# Patient Record
Sex: Male | Born: 1960 | Race: White | Hispanic: No | Smoking: Former smoker
Health system: Southern US, Community
[De-identification: ages and names within clinical notes are randomized; demographics above are authoritative.]

## PROBLEM LIST (undated history)

## (undated) DIAGNOSIS — E119 Type 2 diabetes mellitus without complications: Secondary | ICD-10-CM

## (undated) DIAGNOSIS — I2510 Atherosclerotic heart disease of native coronary artery without angina pectoris: Secondary | ICD-10-CM

## (undated) DIAGNOSIS — J45909 Unspecified asthma, uncomplicated: Secondary | ICD-10-CM

## (undated) DIAGNOSIS — M1990 Unspecified osteoarthritis, unspecified site: Secondary | ICD-10-CM

## (undated) DIAGNOSIS — M549 Dorsalgia, unspecified: Secondary | ICD-10-CM

## (undated) DIAGNOSIS — I10 Essential (primary) hypertension: Secondary | ICD-10-CM

## (undated) DIAGNOSIS — I509 Heart failure, unspecified: Secondary | ICD-10-CM

## (undated) DIAGNOSIS — J449 Chronic obstructive pulmonary disease, unspecified: Secondary | ICD-10-CM

---

## 1998-03-21 ENCOUNTER — Encounter: Payer: Self-pay | Admitting: Internal Medicine

## 1998-03-21 ENCOUNTER — Emergency Department (HOSPITAL_COMMUNITY): Admission: EM | Admit: 1998-03-21 | Discharge: 1998-03-21 | Payer: Self-pay | Admitting: Internal Medicine

## 2011-04-18 HISTORY — PX: VENTRAL HERNIA REPAIR: SHX424

## 2017-05-04 ENCOUNTER — Inpatient Hospital Stay (HOSPITAL_BASED_OUTPATIENT_CLINIC_OR_DEPARTMENT_OTHER)
Admission: EM | Admit: 2017-05-04 | Discharge: 2017-05-07 | DRG: 389 | Disposition: A | Discharge status: Home / Self Care | Payer: Medicare HMO | Attending: Internal Medicine | Admitting: Internal Medicine

## 2017-05-04 ENCOUNTER — Other Ambulatory Visit: Payer: Self-pay

## 2017-05-04 ENCOUNTER — Encounter (HOSPITAL_BASED_OUTPATIENT_CLINIC_OR_DEPARTMENT_OTHER): Payer: Self-pay | Admitting: Emergency Medicine

## 2017-05-04 ENCOUNTER — Emergency Department (HOSPITAL_BASED_OUTPATIENT_CLINIC_OR_DEPARTMENT_OTHER): Payer: Medicare HMO

## 2017-05-04 DIAGNOSIS — G4733 Obstructive sleep apnea (adult) (pediatric): Secondary | ICD-10-CM | POA: Diagnosis present

## 2017-05-04 DIAGNOSIS — J449 Chronic obstructive pulmonary disease, unspecified: Secondary | ICD-10-CM | POA: Diagnosis present

## 2017-05-04 DIAGNOSIS — N179 Acute kidney failure, unspecified: Secondary | ICD-10-CM | POA: Diagnosis present

## 2017-05-04 DIAGNOSIS — I2510 Atherosclerotic heart disease of native coronary artery without angina pectoris: Secondary | ICD-10-CM | POA: Diagnosis present

## 2017-05-04 DIAGNOSIS — Z8679 Personal history of other diseases of the circulatory system: Secondary | ICD-10-CM

## 2017-05-04 DIAGNOSIS — F40240 Claustrophobia: Secondary | ICD-10-CM | POA: Diagnosis present

## 2017-05-04 DIAGNOSIS — Z6836 Body mass index (BMI) 36.0-36.9, adult: Secondary | ICD-10-CM

## 2017-05-04 DIAGNOSIS — K56609 Unspecified intestinal obstruction, unspecified as to partial versus complete obstruction: Secondary | ICD-10-CM | POA: Diagnosis not present

## 2017-05-04 DIAGNOSIS — D72829 Elevated white blood cell count, unspecified: Secondary | ICD-10-CM | POA: Diagnosis present

## 2017-05-04 DIAGNOSIS — Z0189 Encounter for other specified special examinations: Secondary | ICD-10-CM

## 2017-05-04 DIAGNOSIS — E1169 Type 2 diabetes mellitus with other specified complication: Secondary | ICD-10-CM

## 2017-05-04 DIAGNOSIS — Z87891 Personal history of nicotine dependence: Secondary | ICD-10-CM

## 2017-05-04 DIAGNOSIS — E1165 Type 2 diabetes mellitus with hyperglycemia: Secondary | ICD-10-CM | POA: Diagnosis present

## 2017-05-04 DIAGNOSIS — E860 Dehydration: Secondary | ICD-10-CM | POA: Diagnosis present

## 2017-05-04 DIAGNOSIS — R112 Nausea with vomiting, unspecified: Secondary | ICD-10-CM | POA: Diagnosis not present

## 2017-05-04 DIAGNOSIS — E669 Obesity, unspecified: Secondary | ICD-10-CM

## 2017-05-04 DIAGNOSIS — I10 Essential (primary) hypertension: Secondary | ICD-10-CM | POA: Diagnosis present

## 2017-05-04 DIAGNOSIS — E875 Hyperkalemia: Secondary | ICD-10-CM | POA: Diagnosis present

## 2017-05-04 DIAGNOSIS — E871 Hypo-osmolality and hyponatremia: Secondary | ICD-10-CM | POA: Diagnosis present

## 2017-05-04 HISTORY — DX: Chronic obstructive pulmonary disease, unspecified: J44.9

## 2017-05-04 HISTORY — DX: Atherosclerotic heart disease of native coronary artery without angina pectoris: I25.10

## 2017-05-04 HISTORY — DX: Essential (primary) hypertension: I10

## 2017-05-04 HISTORY — DX: Dorsalgia, unspecified: M54.9

## 2017-05-04 HISTORY — DX: Unspecified osteoarthritis, unspecified site: M19.90

## 2017-05-04 HISTORY — DX: Type 2 diabetes mellitus without complications: E11.9

## 2017-05-04 HISTORY — DX: Unspecified asthma, uncomplicated: J45.909

## 2017-05-04 HISTORY — DX: Heart failure, unspecified: I50.9

## 2017-05-04 LAB — CBC
HCT: 43 % (ref 39.0–52.0)
HEMOGLOBIN: 14.4 g/dL (ref 13.0–17.0)
MCH: 28.1 pg (ref 26.0–34.0)
MCHC: 33.5 g/dL (ref 30.0–36.0)
MCV: 83.8 fL (ref 78.0–100.0)
PLATELETS: 520 10*3/uL — AB (ref 150–400)
RBC: 5.13 MIL/uL (ref 4.22–5.81)
RDW: 13.9 % (ref 11.5–15.5)
WBC: 11.9 10*3/uL — AB (ref 4.0–10.5)

## 2017-05-04 LAB — BASIC METABOLIC PANEL
Anion gap: 14 (ref 5–15)
BUN: 31 mg/dL — ABNORMAL HIGH (ref 6–20)
CO2: 20 mmol/L — ABNORMAL LOW (ref 22–32)
Calcium: 9.9 mg/dL (ref 8.9–10.3)
Chloride: 95 mmol/L — ABNORMAL LOW (ref 101–111)
Creatinine, Ser: 1.54 mg/dL — ABNORMAL HIGH (ref 0.61–1.24)
GFR calc Af Amer: 55 mL/min — ABNORMAL LOW (ref >60)
GFR calc non Af Amer: 48 mL/min — ABNORMAL LOW (ref >60)
Glucose, Bld: 223 mg/dL — ABNORMAL HIGH (ref 65–99)
Potassium: 5.5 mmol/L — ABNORMAL HIGH (ref 3.5–5.1)
Sodium: 129 mmol/L — ABNORMAL LOW (ref 135–145)

## 2017-05-04 LAB — HEPATIC FUNCTION PANEL
ALK PHOS: 63 U/L (ref 38–126)
ALT: 12 U/L — AB (ref 17–63)
AST: 26 U/L (ref 15–41)
Albumin: 4.2 g/dL (ref 3.5–5.0)
BILIRUBIN DIRECT: 0.1 mg/dL (ref 0.1–0.5)
Indirect Bilirubin: 0.4 mg/dL (ref 0.3–0.9)
Total Bilirubin: 0.5 mg/dL (ref 0.3–1.2)
Total Protein: 9.1 g/dL — ABNORMAL HIGH (ref 6.5–8.1)

## 2017-05-04 LAB — LIPASE, BLOOD: Lipase: 48 U/L (ref 11–51)

## 2017-05-04 MED ORDER — MORPHINE SULFATE (PF) 4 MG/ML IV SOLN: 4.0000 mg | Freq: Once | INTRAVENOUS | Status: DC

## 2017-05-04 MED ORDER — ONDANSETRON HCL 4 MG/2ML IJ SOLN
4.0000 mg | Freq: Once | INTRAMUSCULAR | Status: AC
  Administered 2017-05-05: 4 mg via INTRAVENOUS
  Filled 2017-05-04: qty 2

## 2017-05-04 MED ORDER — SODIUM CHLORIDE 0.9 % IV BOLUS (SEPSIS)
500.0000 mL | Freq: Once | INTRAVENOUS | Status: AC
  Administered 2017-05-04: 500 mL via INTRAVENOUS

## 2017-05-04 MED ORDER — ONDANSETRON 4 MG PO TBDP
4.0000 mg | ORAL_TABLET | Freq: Once | ORAL | Status: AC
  Administered 2017-05-04: 4 mg via ORAL
  Filled 2017-05-04: qty 1

## 2017-05-04 MED ORDER — SODIUM CHLORIDE 0.9 % IV SOLN
INTRAVENOUS | Status: DC
  Administered 2017-05-05: via INTRAVENOUS

## 2017-05-04 MED ORDER — IOPAMIDOL (ISOVUE-300) INJECTION 61%
100.0000 mL | Freq: Once | INTRAVENOUS | Status: AC | PRN
  Administered 2017-05-04: 100 mL via INTRAVENOUS

## 2017-05-04 MED ORDER — HYDROMORPHONE HCL 1 MG/ML IJ SOLN
1.0000 mg | Freq: Once | INTRAMUSCULAR | Status: AC
  Administered 2017-05-05: 1 mg via INTRAVENOUS
  Filled 2017-05-04: qty 1

## 2017-05-04 MED ORDER — DICYCLOMINE HCL 10 MG PO CAPS
10.0000 mg | ORAL_CAPSULE | Freq: Once | ORAL | Status: AC
Start: 2017-05-04 — End: 2017-05-04
  Administered 2017-05-04: 10 mg via ORAL
  Filled 2017-05-04: qty 1

## 2017-05-04 MED ORDER — FENTANYL CITRATE (PF) 100 MCG/2ML IJ SOLN
50.0000 ug | Freq: Once | INTRAMUSCULAR | Status: AC
  Administered 2017-05-04: 50 ug via INTRAVENOUS
  Filled 2017-05-04: qty 2

## 2017-05-04 NOTE — ED Provider Notes (Signed)
[facility] EMERGENCY DEPARTMENT Provider Note   CSN: [id number] Arrival date & time: 05/04/17  2021  History   Chief Complaint Chief Complaint  Patient presents with  . Emesis    HPI Steven Rubio is a 57 y.o. male with history of COPD and T2DM, CAD and CHF presenting with nausea, vomiting and diarrhea starting yesterday evening. Patient reports he has had many watery stools without hematochezia or melena. He reports 7 episodes of NBNB emesis. He endorses muscle aches particularly in his stomach. Afebrile at home with temperatures 98.1-99.22F. He reports he has been tolerating fluids and has a bottle of gatorade that is almost empty with him in bed. No dysuria. No sore throat, rhinorrhea, or congestion. No chest pain or dyspnea.  Past Medical History:  Diagnosis Date  . Arthritis   . Asthma   . Back pain   . CHF (congestive heart failure) (HCC)   . COPD (chronic obstructive pulmonary disease) (HCC)   . Coronary artery disease   . Diabetes mellitus without complication (HCC)   . Hypertension     There are no active problems to display for this patient.   Past Surgical History:  Procedure Laterality Date  . HERNIA REPAIR       Home Medications    Prior to Admission medications   Medication Sig Start Date End Date Taking? Authorizing Provider  meloxicam (MOBIC) 7.5 MG tablet Take 7.5 mg by mouth daily.   Yes [provider]  NIFEdipine (PROCARDIA-XL/ADALAT CC) 60 MG 24 hr tablet Take 60 mg by mouth daily.   Yes [provider]  oxyCODONE-acetaminophen (PERCOCET/ROXICET) 5-325 MG tablet Take by mouth every 4 (four) hours as needed for severe pain.   Yes [provider]    Family History History reviewed. No pertinent family history.  Social History Social History   Tobacco Use  . Smoking status: Former Games developermoker  . Smokeless tobacco: Never Used  Substance Use Topics  . Alcohol use: No    Frequency: Never  . Drug use: No      Allergies   Penicillins   Review of Systems Review of Systems See HPI For ROS  Physical Exam Updated Vital Signs BP (!) 153/88   Pulse 96   Temp 97.7 F (36.5 C) (Oral)   Resp (!) 22   Ht 5\' 7"  (1.702 m)   Wt 101.6 kg (224 lb)   SpO2 90%   BMI 35.08 kg/m   Physical Exam  Constitutional: He is oriented to person, place, and time. He appears well-developed and well-nourished.  HENT:  Head: Normocephalic and atraumatic.  Mouth/Throat: Oropharynx is clear and moist. No oropharyngeal exudate.  Eyes: EOM are normal. Pupils are equal, round, and reactive to light.  Neck: Neck supple.  Cardiovascular: Normal rate and regular rhythm. Exam reveals no gallop and no friction rub.  No murmur heard. Pulmonary/Chest: Breath sounds normal. He has no wheezes. He has no rales.  Abdominal: Soft. He exhibits no distension. There is no tenderness. There is no rebound and no guarding.  Musculoskeletal: Normal range of motion.  Neurological: He is alert and oriented to person, place, and time.  Skin: Skin is warm and dry.     ED Treatments / Results  Labs (all labs ordered are listed, but only abnormal results are displayed) Labs Reviewed  BASIC METABOLIC PANEL - Abnormal; Notable for the following components:      Result Value   Sodium 129 (*)    Potassium 5.5 (*)  Chloride 95 (*)    CO2 20 (*)    Glucose, Bld 223 (*)    BUN 31 (*)    Creatinine, Ser 1.54 (*)    GFR calc non Af Amer 48 (*)    GFR calc Af Amer 55 (*)    All other components within normal limits  CBC - Abnormal; Notable for the following components:   WBC 11.9 (*)    Platelets 520 (*)    All other components within normal limits  HEPATIC FUNCTION PANEL  LIPASE, BLOOD    EKG  EKG Interpretation None       Radiology No results found.  Procedures Procedures (including critical care time)  Medications Ordered in ED Medications  sodium chloride 0.9 % bolus 500 mL (0 mLs Intravenous Stopped  05/04/17 2139)  ondansetron (ZOFRAN-ODT) disintegrating tablet 4 mg (4 mg Oral Given 05/04/17 2118)  sodium chloride 0.9 % bolus 500 mL (0 mLs Intravenous Stopped 05/04/17 2229)  dicyclomine (BENTYL) capsule 10 mg (10 mg Oral Given 05/04/17 2232)  fentaNYL (SUBLIMAZE) injection 50 mcg (50 mcg Intravenous Given 05/04/17 2305)  iopamidol (ISOVUE-300) 61 % injection 100 mL (100 mLs Intravenous Contrast Given 05/04/17 2308)   Initial Impression / Assessment and Plan / ED Course  I have reviewed the triage vital signs and the nursing notes.  Pertinent labs & imaging results that were available during my care of the patient were reviewed by me and considered in my medical decision making (see chart for details).    57 year old male with COPD and CHF presents with N/V at home x1 day with diarrhea, most concerning for a viral illness. He was treated with zofran and gentle fluid bolus in ED. Tolerating fluids well. BMP suggestive of dehydration.   Patient monitored in ED and noted to have worsening abdominal pain as well as continued diarrhea. Concern for possible diverticulitis and so CT abdomen was ordered.   Care was taken over by Dr. Elesa Massed at 11:15PM.   Final Clinical Impressions(s) / ED Diagnoses   Final diagnoses:  None    ED Discharge Orders    None       Howard Pouch, MD 05/04/17 1610    Gwyneth Sprout, MD 05/05/17 1105

## 2017-05-04 NOTE — ED Triage Notes (Signed)
pateint states that he started to have emesis starting last night. The patient reports that he is having N/V/D

## 2017-05-04 NOTE — ED Provider Notes (Signed)
12:00 AM  Assumed care from Dr. Anitra LauthPlunkett.  Patient is a 57 year old morbidly obese male with history of COPD, CHF who presents to the emergency department with nausea, vomiting or diarrhea.  Pain increasing throughout ED's visit.  Found to be mildly hyponatremic, hyperkalemic and mild AK I.  Receiving IV fluids.  Plan is to obtain CT scan for further evaluation.  1:20 AM CT scan shows small bowel obstruction.  Transition point not confidently identified.  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  Will discuss with medicine for admission.  Will place NG tube.  D/w Dr. Michaell CowingGross.  Surgery will see in consult.  Patient has never had a bowel obstruction before.  He has had one previous her[facility] repair [facility].  1:28 AM Discussed patient's case with hospitalist, Dr. Katrinka BlazingSmith.  I have recommended admission and patient (and family if present) agree with this plan. Admitting physician will place admission orders.   I reviewed all nursing notes, vitals, pertinent previous records, EKGs, lab and urine results, imaging (as available).  2:20 AM  Pt's NG tube placed by nursing staff.  X-ray confirms that it is in the tip of the stomach.  He is already feeling better.  Remains hemodynamically stable.  [facility]  EKG obtained given his hyperkalemia which shows no peak T waves or interval abnormality.  He has received IV hydration.  Suspect hyperkalemia secondary to dehydration from vomiting and diarrhea.  Standing orders for pain medication, nausea medicine IV fluids have been ordered.  He is n.p.o.  We have discussed with him the importance of bowel rest.     EKG Interpretation  Date/Time:  Saturday May 05 2017 01:57:21 EST Ventricular Rate:  99 PR Interval:    QRS Duration: 87 QT Interval:  349 QTC Calculation: 448 R Axis:   75 Text Interpretation:  Sinus rhythm Borderline T abnormalities, inferior leads No old tracing to compare  Confirmed by Keilly Fatula, Baxter HireKristen [phone] on 05/05/2017 2:17:23 AM         Brailynn Breth, Layla MawKristen N, DO 05/05/17 [id number]

## 2017-05-04 NOTE — ED Notes (Signed)
Up to b/r by w/c with family.

## 2017-05-04 NOTE — ED Notes (Addendum)
Alert, NAD, calm, interactive, resps e/u, speaking in clear complete sentences, no dyspnea noted, skin W&D, VSS, c/o 2d of NVD and subjective fever, Vx8, Dx12, describes emesis as pinkish, stool as liquid, states, "have been able to keep down some liquids (gatorade/water 50/50)", (denies: recent illness, URI sx, GU sx, sob, bleeding, dizziness or visual changes). EDP Dr. Mosetta PuttFeng into room. [facility] at [facility].

## 2017-05-05 ENCOUNTER — Emergency Department (HOSPITAL_BASED_OUTPATIENT_CLINIC_OR_DEPARTMENT_OTHER): Payer: Medicare HMO

## 2017-05-05 ENCOUNTER — Inpatient Hospital Stay (HOSPITAL_COMMUNITY): Payer: Medicare HMO

## 2017-05-05 DIAGNOSIS — Z6836 Body mass index (BMI) 36.0-36.9, adult: Secondary | ICD-10-CM | POA: Diagnosis not present

## 2017-05-05 DIAGNOSIS — J449 Chronic obstructive pulmonary disease, unspecified: Secondary | ICD-10-CM | POA: Diagnosis present

## 2017-05-05 DIAGNOSIS — D72829 Elevated white blood cell count, unspecified: Secondary | ICD-10-CM | POA: Diagnosis present

## 2017-05-05 DIAGNOSIS — Z8679 Personal history of other diseases of the circulatory system: Secondary | ICD-10-CM | POA: Diagnosis not present

## 2017-05-05 DIAGNOSIS — R112 Nausea with vomiting, unspecified: Secondary | ICD-10-CM | POA: Diagnosis present

## 2017-05-05 DIAGNOSIS — K56609 Unspecified intestinal obstruction, unspecified as to partial versus complete obstruction: Principal | ICD-10-CM | POA: Diagnosis present

## 2017-05-05 DIAGNOSIS — E1169 Type 2 diabetes mellitus with other specified complication: Secondary | ICD-10-CM | POA: Diagnosis not present

## 2017-05-05 DIAGNOSIS — F40240 Claustrophobia: Secondary | ICD-10-CM | POA: Diagnosis present

## 2017-05-05 DIAGNOSIS — Z87891 Personal history of nicotine dependence: Secondary | ICD-10-CM | POA: Diagnosis not present

## 2017-05-05 DIAGNOSIS — N179 Acute kidney failure, unspecified: Secondary | ICD-10-CM

## 2017-05-05 DIAGNOSIS — G4733 Obstructive sleep apnea (adult) (pediatric): Secondary | ICD-10-CM | POA: Diagnosis present

## 2017-05-05 DIAGNOSIS — I10 Essential (primary) hypertension: Secondary | ICD-10-CM | POA: Diagnosis present

## 2017-05-05 DIAGNOSIS — E669 Obesity, unspecified: Secondary | ICD-10-CM | POA: Diagnosis not present

## 2017-05-05 DIAGNOSIS — E1165 Type 2 diabetes mellitus with hyperglycemia: Secondary | ICD-10-CM | POA: Diagnosis present

## 2017-05-05 DIAGNOSIS — E860 Dehydration: Secondary | ICD-10-CM | POA: Diagnosis present

## 2017-05-05 DIAGNOSIS — E875 Hyperkalemia: Secondary | ICD-10-CM

## 2017-05-05 DIAGNOSIS — E871 Hypo-osmolality and hyponatremia: Secondary | ICD-10-CM | POA: Diagnosis present

## 2017-05-05 DIAGNOSIS — I2510 Atherosclerotic heart disease of native coronary artery without angina pectoris: Secondary | ICD-10-CM | POA: Diagnosis present

## 2017-05-05 LAB — CBC WITH DIFFERENTIAL/PLATELET
BASOS PCT: 0 %
Basophils Absolute: 0 10*3/uL (ref 0.0–0.1)
EOS ABS: 0.2 10*3/uL (ref 0.0–0.7)
EOS PCT: 2 %
HCT: 39.8 % (ref 39.0–52.0)
HEMOGLOBIN: 13.5 g/dL (ref 13.0–17.0)
Lymphocytes Relative: 25 %
Lymphs Abs: 2.5 10*3/uL (ref 0.7–4.0)
MCH: 28.7 pg (ref 26.0–34.0)
MCHC: 33.9 g/dL (ref 30.0–36.0)
MCV: 84.5 fL (ref 78.0–100.0)
Monocytes Absolute: 1.1 10*3/uL — ABNORMAL HIGH (ref 0.1–1.0)
Monocytes Relative: 11 %
Neutro Abs: 6.1 10*3/uL (ref 1.7–7.7)
Neutrophils Relative %: 62 %
PLATELETS: 429 10*3/uL — AB (ref 150–400)
RBC: 4.71 MIL/uL (ref 4.22–5.81)
RDW: 13.7 % (ref 11.5–15.5)
WBC: 9.9 10*3/uL (ref 4.0–10.5)

## 2017-05-05 LAB — BASIC METABOLIC PANEL
Anion gap: 10 (ref 5–15)
BUN: 29 mg/dL — ABNORMAL HIGH (ref 6–20)
CHLORIDE: 95 mmol/L — AB (ref 101–111)
CO2: 25 mmol/L (ref 22–32)
CREATININE: 1.16 mg/dL (ref 0.61–1.24)
Calcium: 9.5 mg/dL (ref 8.9–10.3)
Glucose, Bld: 145 mg/dL — ABNORMAL HIGH (ref 65–99)
Potassium: 4.9 mmol/L (ref 3.5–5.1)
SODIUM: 130 mmol/L — AB (ref 135–145)

## 2017-05-05 LAB — GLUCOSE, CAPILLARY
GLUCOSE-CAPILLARY: 136 mg/dL — AB (ref 65–99)
GLUCOSE-CAPILLARY: 120 mg/dL — AB (ref 65–99)
Glucose-Capillary: 150 mg/dL — ABNORMAL HIGH (ref 65–99)
Glucose-Capillary: 115 mg/dL — ABNORMAL HIGH (ref 65–99)

## 2017-05-05 MED ORDER — BISACODYL 10 MG RE SUPP
10.0000 mg | Freq: Every day | RECTAL | Status: DC
  Administered 2017-05-05: 10 mg via RECTAL
  Filled 2017-05-05 (×3): qty 1

## 2017-05-05 MED ORDER — INSULIN ASPART 100 UNIT/ML ~~LOC~~ SOLN
0.0000 [IU] | SUBCUTANEOUS | Status: DC
  Administered 2017-05-05: 2 [IU] via SUBCUTANEOUS
  Administered 2017-05-06 (×3): 1 [IU] via SUBCUTANEOUS

## 2017-05-05 MED ORDER — HYDROCORTISONE 1 % EX CREA: 1.0000 "application " | TOPICAL_CREAM | Freq: Three times a day (TID) | CUTANEOUS | Status: DC | PRN

## 2017-05-05 MED ORDER — PROCHLORPERAZINE EDISYLATE 5 MG/ML IJ SOLN: 5.0000 mg | INTRAMUSCULAR | Status: DC | PRN

## 2017-05-05 MED ORDER — MENTHOL 3 MG MT LOZG: 1.0000 | LOZENGE | OROMUCOSAL | Status: DC | PRN

## 2017-05-05 MED ORDER — PANTOPRAZOLE SODIUM 40 MG IV SOLR
40.0000 mg | Freq: Two times a day (BID) | INTRAVENOUS | Status: DC
  Administered 2017-05-05 – 2017-05-07 (×4): 40 mg via INTRAVENOUS
  Filled 2017-05-05 (×7): qty 40

## 2017-05-05 MED ORDER — DIPHENHYDRAMINE HCL 50 MG/ML IJ SOLN
25.0000 mg | Freq: Once | INTRAMUSCULAR | Status: AC
Start: 2017-05-05 — End: 2017-05-05
  Administered 2017-05-05: 25 mg via INTRAVENOUS
  Filled 2017-05-05: qty 1

## 2017-05-05 MED ORDER — ACETAMINOPHEN 650 MG RE SUPP: 650.0000 mg | Freq: Four times a day (QID) | RECTAL | Status: DC | PRN

## 2017-05-05 MED ORDER — ENOXAPARIN SODIUM 40 MG/0.4ML ~~LOC~~ SOLN
40.0000 mg | SUBCUTANEOUS | Status: DC
  Administered 2017-05-05 – 2017-05-07 (×3): 40 mg via SUBCUTANEOUS
  Filled 2017-05-05 (×3): qty 0.4

## 2017-05-05 MED ORDER — ONDANSETRON HCL 4 MG/2ML IJ SOLN: 4.0000 mg | Freq: Four times a day (QID) | INTRAMUSCULAR | Status: DC | PRN

## 2017-05-05 MED ORDER — METOCLOPRAMIDE HCL 5 MG/ML IJ SOLN: 5.0000 mg | Freq: Four times a day (QID) | INTRAMUSCULAR | Status: DC | PRN

## 2017-05-05 MED ORDER — LACTATED RINGERS IV BOLUS (SEPSIS)
1000.0000 mL | Freq: Once | INTRAVENOUS | Status: AC
  Administered 2017-05-05: 1000 mL via INTRAVENOUS

## 2017-05-05 MED ORDER — HYDRALAZINE HCL 20 MG/ML IJ SOLN: 10.0000 mg | INTRAMUSCULAR | Status: DC | PRN

## 2017-05-05 MED ORDER — GUAIFENESIN-DM 100-10 MG/5ML PO SYRP: 10.0000 mL | ORAL_SOLUTION | ORAL | Status: DC | PRN

## 2017-05-05 MED ORDER — IPRATROPIUM-ALBUTEROL 0.5-2.5 (3) MG/3ML IN SOLN: 3.0000 mL | RESPIRATORY_TRACT | Status: DC | PRN

## 2017-05-05 MED ORDER — ALUM & MAG HYDROXIDE-SIMETH 200-200-20 MG/5ML PO SUSP: 30.0000 mL | Freq: Four times a day (QID) | ORAL | Status: DC | PRN

## 2017-05-05 MED ORDER — DIATRIZOATE MEGLUMINE & SODIUM 66-10 % PO SOLN
90.0000 mL | Freq: Once | ORAL | Status: DC
  Filled 2017-05-05 (×2): qty 90

## 2017-05-05 MED ORDER — BENZOCAINE 20 % MT AERO
INHALATION_SPRAY | Freq: Once | OROMUCOSAL | Status: AC
  Administered 2017-05-05: 1 via OROMUCOSAL
  Filled 2017-05-05: qty 57

## 2017-05-05 MED ORDER — SODIUM CHLORIDE 0.9 % IV SOLN
INTRAVENOUS | Status: DC
  Administered 2017-05-05: 05:00:00 via INTRAVENOUS

## 2017-05-05 MED ORDER — DIPHENHYDRAMINE HCL 50 MG/ML IJ SOLN: 12.5000 mg | Freq: Four times a day (QID) | INTRAMUSCULAR | Status: DC | PRN

## 2017-05-05 MED ORDER — LIP MEDEX EX OINT
1.0000 "application " | TOPICAL_OINTMENT | Freq: Two times a day (BID) | CUTANEOUS | Status: DC
  Administered 2017-05-05 – 2017-05-07 (×5): 1 via TOPICAL
  Filled 2017-05-05: qty 7

## 2017-05-05 MED ORDER — DIATRIZOATE MEGLUMINE & SODIUM 66-10 % PO SOLN
90.0000 mL | Freq: Once | ORAL | Status: AC
  Administered 2017-05-05: 90 mL via NASOGASTRIC
  Filled 2017-05-05: qty 90

## 2017-05-05 MED ORDER — ONDANSETRON HCL 4 MG PO TABS: 4.0000 mg | ORAL_TABLET | Freq: Four times a day (QID) | ORAL | Status: DC | PRN

## 2017-05-05 MED ORDER — METOCLOPRAMIDE HCL 5 MG/ML IJ SOLN
10.0000 mg | Freq: Once | INTRAMUSCULAR | Status: AC
  Administered 2017-05-05: 10 mg via INTRAVENOUS
  Filled 2017-05-05: qty 2

## 2017-05-05 MED ORDER — MAGIC MOUTHWASH
15.0000 mL | Freq: Four times a day (QID) | ORAL | Status: DC | PRN
  Filled 2017-05-05: qty 15

## 2017-05-05 MED ORDER — ACETAMINOPHEN 325 MG PO TABS: 650.0000 mg | ORAL_TABLET | Freq: Four times a day (QID) | ORAL | Status: DC | PRN

## 2017-05-05 MED ORDER — LORAZEPAM 2 MG/ML IJ SOLN
0.5000 mg | Freq: Once | INTRAMUSCULAR | Status: AC
  Administered 2017-05-05: 0.5 mg via INTRAVENOUS
  Filled 2017-05-05: qty 1

## 2017-05-05 MED ORDER — HYDROMORPHONE HCL 1 MG/ML IJ SOLN
1.0000 mg | INTRAMUSCULAR | Status: DC | PRN
  Administered 2017-05-05 (×2): 1 mg via INTRAVENOUS
  Filled 2017-05-05 (×2): qty 1

## 2017-05-05 MED ORDER — OXYMETAZOLINE HCL 0.05 % NA SOLN
1.0000 | Freq: Once | NASAL | Status: AC
  Administered 2017-05-05: 1 via NASAL
  Filled 2017-05-05: qty 15

## 2017-05-05 MED ORDER — LACTATED RINGERS IV BOLUS (SEPSIS): 1000.0000 mL | Freq: Three times a day (TID) | INTRAVENOUS | Status: DC | PRN

## 2017-05-05 MED ORDER — LIDOCAINE HCL 2 % EX GEL
1.0000 "application " | Freq: Once | CUTANEOUS | Status: AC
  Administered 2017-05-05: 1
  Filled 2017-05-05: qty 20

## 2017-05-05 MED ORDER — HYDROCORTISONE 2.5 % RE CREA: 1.0000 "application " | TOPICAL_CREAM | Freq: Four times a day (QID) | RECTAL | Status: DC | PRN

## 2017-05-05 MED ORDER — SODIUM CHLORIDE 0.9 % IV SOLN
8.0000 mg | Freq: Four times a day (QID) | INTRAVENOUS | Status: DC | PRN
  Filled 2017-05-05: qty 4

## 2017-05-05 MED ORDER — PHENOL 1.4 % MT LIQD: 1.0000 | OROMUCOSAL | Status: DC | PRN

## 2017-05-05 NOTE — ED Notes (Signed)
[facility] here, no changes, alert, NAD, calm, "feeling better".

## 2017-05-05 NOTE — ED Notes (Signed)
Hospitalist re-paged due to no call back

## 2017-05-05 NOTE — Progress Notes (Signed)
[facility] PROGRESS NOTE    Progress Note  Steven Rubio  WUJ:[id number] DOB: 09-01-1960 DOA: 05/04/2017 PCP: [facility]     Brief Narrative:   Steven Rubio is an 57 y.o. male past medical history significant for essential hypertension, diabetes mellitus and COPD remote history of tobacco abuse that comes into the hospital for 2 days of nausea vomiting and diarrhea he reports having 6-7 bouts of emesis nonbloody, and 3-2 watery bowel movement.  He denies any abdominal pain in the ED, CT scan show sign of small bowel obstruction surgery was consulted who recommended conservative management.  Assessment/Plan:   SBO (small bowel obstruction) (HCC) General surgery was consulted in the ED that recommended conservative management, I do not see a note from them I will go ahead and reconsult them.  NG tube in place. N.p.o., IV fluids nasogastric tube to suction and ambulation. Continue narcotics for pain. Surgery on board awaiting further recommendations. Relates he is passing gas and having bowel movements. Ambulate patient as tolerated.  Leucytosis: Has remained afebrile question reactive repeat CBC COPD (chronic obstructive pulmonary disease) (HCC)  Acute kidney injury unknown baseline: Repeat a basic metabolic panel after fluid hydration, her BUN to creatinine ratio is 31/1.5 Question prerenal in etiology.  Hyperkalemia: No changes on EKG he was started on IV fluids. Recheck basic metabolic panel.  Diabetes mellitus type 2 in obese (HCC) Continue sliding scale insulin for n.p.o.  Hypovolemic hyponatremia: Likely due to hypovolemia started on IV fluids, recheck basic metabolic panel in the morning.  History of heart failure: I do not see any previous echoes documented continue strict I's and O's   DVT prophylaxis: lovenox Family Communication:none Disposition Plan/Barrier to D/C: home once tolerating diet Code Status:     Code Status Orders  (From  admission, onward)        Start     Ordered   05/05/17 0348  Full code  Continuous     05/05/17 0347    Code Status History    Date Active Date Inactive Code Status Order ID Comments User Context   This patient has a current code status but no historical code status.        IV Access:    Peripheral IV   Procedures and diagnostic studies:   Ct Abdomen Pelvis W Contrast  Result Date: 05/04/2017 CLINICAL DATA:  Nausea and vomiting. EXAM: CT ABDOMEN AND PELVIS WITH CONTRAST TECHNIQUE: Multidetector CT imaging of the abdomen and pelvis was performed using the standard protocol following bolus administration of intravenous contrast. CONTRAST:  ISOVUE-300 IOPAMIDOL (ISOVUE-300) INJECTION 61% COMPARISON:  None FINDINGS: Note: Lower chest: No acute abnormality. RCA coronary artery calcifications noted. Aortic atherosclerosis identified. Hepatobiliary: No focal liver abnormality is seen. No gallstones, gallbladder wall thickening, or biliary dilatation. Pancreas: Unremarkable. No pancreatic ductal dilatation or surrounding inflammatory changes. Spleen: Normal in size without focal abnormality. Adrenals/Urinary Tract: The adrenal glands are normal. Unremarkable appearance of both kidneys. The urinary bladder appears normal. Stomach/Bowel: The stomach is distended. Proximal small bowel loops are abnormally dilated with multiple fluid levels identified. A transition to decreased caliber small bowel loops may be within the central portion of the lower abdomen. The appendix is visualized and appears normal. Normal appearance of the colon. Vascular/Lymphatic: Aortic atherosclerosis. Infrarenal abdominal aortic ectasia measures 2.7 cm. No adenopathy. No pelvic or inguinal adenopathy. Reproductive: Prostate is unremarkable. Other: Because of patient's body habitus the ventral abdominal wall is incompletely imaged. A ventral abdominal wall hernia  cannot be excluded because this area is not fully  visualized. Musculoskeletal: No acute or significant osseous findings. IMPRESSION: 1. Examination is positive for small bowel obstruction. Transition point not confidently identified but may be within the central portion of the lower abdomen. Note, that the ventral abdominal wall is incompletely visualized due to patient's body habitus. If there is a ventral abdominal wall hernia (with or without bowel loops) this may not be imaged for this reason. 2. Aortic Atherosclerosis (ICD10-I70.0). RCA coronary artery calcifications noted. 3. Ectatic abdominal aorta at risk for aneurysm development. Recommend followup by ultrasound in 5 years. This recommendation follows ACR consensus guidelines: White Paper of the ACR Incidental Findings Committee II on Vascular Findings. J Am Coll Radiol 2013; 10:789-794. Electronically Signed   By: Signa Kellaylor  Stroud M.D.   On: 05/04/2017 23:56   Dg Abd Portable 1v-small Bowel Protocol-position Verification  Result Date: 05/05/2017 CLINICAL DATA:  Status post NG tube placement EXAM: PORTABLE ABDOMEN - 1 VIEW COMPARISON:  05/04/2017 FINDINGS: Nasogastric tube tip is in the body of the stomach. The side port is well below GE junction. Multiple dilated loops of small bowel are again noted compatible with bowel obstruction. IMPRESSION: NG tube tip is in the body of the stomach. Electronically Signed   By: Signa Kellaylor  Stroud M.D.   On: 05/05/2017 01:51     Medical Consultants:    None.  Anti-Infectives:   None  Subjective:    Steven Rubio he relates he is passing gas and just had a bowel movement this morning  Objective:    Vitals:   05/05/17 0130 05/05/17 0207 05/05/17 0245 05/05/17 0400  BP: (!) 171/92 (!) 156/73  (!) 170/84  Pulse: 100 95 100 96  Resp:  16 (!) 28 16  Temp:    98.2 F (36.8 C)  TempSrc:    Oral  SpO2: 94% 93% 96% 97%  Weight:    102.5 kg (225 lb 15.5 oz)  Height:    5\' 6"  (1.676 m)    Intake/Output Summary (Last 24 hours) at 05/05/2017 0820 Last  data filed at 05/05/2017 0250 Gross per 24 hour  Intake 1000 ml  Output 1050 ml  Net -50 ml   Filed Weights   05/04/17 2024 05/05/17 0400  Weight: 101.6 kg (224 lb) 102.5 kg (225 lb 15.5 oz)    Exam: General exam: In no acute distress. Respiratory system: Good air movement and clear to auscultation. Cardiovascular system: S1 & S2 heard, RRR. No JVD. Gastrointestinal system: Continues to be massively distended, positive bowel sounds nontender Central nervous system: Alert and oriented. No focal neurological deficits. Extremities: No pedal edema. Skin: No rashes, lesions or ulcers Psychiatry: Judgement and insight appear normal. Mood & affect appropriate.    Data Reviewed:    Labs: Basic Metabolic Panel: Recent Labs  Lab 05/04/17 2055  NA 129*  K 5.5*  CL 95*  CO2 20*  GLUCOSE 223*  BUN 31*  CREATININE 1.54*  CALCIUM 9.9   GFR Estimated Creatinine Clearance: 57.9 mL/min (A) (by C-G formula based on SCr of 1.54 mg/dL (H)). Liver Function Tests: Recent Labs  Lab 05/04/17 2055  AST 26  ALT 12*  ALKPHOS 63  BILITOT 0.5  PROT 9.1*  ALBUMIN 4.2   Recent Labs  Lab 05/04/17 2055  LIPASE 48   No results for input(s): AMMONIA in the last 168 hours. Coagulation profile No results for input(s): INR, PROTIME in the last 168 hours.  CBC: Recent Labs  Lab 05/04/17  2055  WBC 11.9*  HGB 14.4  HCT 43.0  MCV 83.8  PLT 520*   Cardiac Enzymes: No results for input(s): CKTOTAL, CKMB, CKMBINDEX, TROPONINI in the last 168 hours. BNP (last 3 results) No results for input(s): PROBNP in the last 8760 hours. CBG: Recent Labs  Lab 05/05/17 0555  GLUCAP 150*   D-Dimer: No results for input(s): DDIMER in the last 72 hours. Hgb A1c: No results for input(s): HGBA1C in the last 72 hours. Lipid Profile: No results for input(s): CHOL, HDL, LDLCALC, TRIG, CHOLHDL, LDLDIRECT in the last 72 hours. Thyroid function studies: No results for input(s): TSH, T4TOTAL, T3FREE,  THYROIDAB in the last 72 hours.  Invalid input(s): FREET3 Anemia work up: No results for input(s): VITAMINB12, FOLATE, FERRITIN, TIBC, IRON, RETICCTPCT in the last 72 hours. Sepsis Labs: Recent Labs  Lab 05/04/17 2055  WBC 11.9*   Microbiology No results found for this or any previous visit (from the past 240 hour(s)).   Medications:   . diatrizoate meglumine-sodium  90 mL Per NG tube Once  . enoxaparin (LOVENOX) injection  40 mg Subcutaneous Q24H  . insulin aspart  0-9 Units Subcutaneous Q4H  . pantoprazole (PROTONIX) IV  40 mg Intravenous Q12H   Continuous Infusions: . sodium chloride 100 mL/hr at 05/05/17 0444     LOS: 0 days   Marinda Elk  [facility] Pager [phone]  *Please refer to [email or web address], password [email or web address] to get updated schedule on who will round on this patient, as hospitalists switch teams weekly. If 7PM-7AM, please contact night-coverage at [email or web address], password [email or web address] for any overnight needs.  05/05/2017, 8:20 AM

## 2017-05-05 NOTE — ED Notes (Signed)
[facility]

## 2017-05-05 NOTE — H&P (Signed)
History and Physical    Steven Rubio ZOX:[id number] DOB: December 21, 1960 DOA: 05/04/2017  Referring MD/NP/PA: Rochele Raring, MD PCP: [facility]  Patient coming from: [facility] transfer  Chief Complaint: Nausea, vomiting, diarrhea  I have personally briefly reviewed patient's old medical records in [facility]   HPI: Steven Rubio is a 57 y.o. male with medical history significant of HTN, CHF, DM type II, COPD, and remote history of tobacco abuse; who presents with a 2-day history of nausea, vomiting, and diarrhea.  Reports having 6-7 episodes of emesis that were nonbloody. He had been trying to keep himself hydrated drinking Gatorade.  He noted having multiple episodes of watery nonbloody diarrhea.  Denies any associated symptoms include achy abdominal pain, chills, and possible sick contacts.  Patient's past surgical history includes ventral hernia repair with mesh.  Patient notes that most of his care has been at the [facility].  Also patient previously had reported smoking for over 51 years but quit approximately 2 years ago.  ED Course: Upon admission into the emergency department patient was noted to be afebrile pulse 89-108, respirations 16-22, blood pressures maintained, and O2 saturations 98-100%. Labs revealed WBC 11.9, hemoglobin 14.4, Platelets 520, BUN 129, potassium 5.5, chloride 95, CO2 20, BUN 31, creatinine 1.54, and glucose 232.CT scan showed signs of small bowel obstruction. Dr. Star Age of general surgery was consulted.  He received 1 L of normal saline fluids, IV pain medication, and NGT was placed to suction.  Accepted to a MedSurg bed.   Review of Systems  Constitutional: Positive for chills. Negative for fever and malaise/fatigue.  HENT: Negative for congestion and nosebleeds.   Eyes: Negative for pain and discharge.  Respiratory: Negative for cough and hemoptysis.   Cardiovascular: Negative for chest pain and leg swelling.  Gastrointestinal: Positive for  abdominal pain, diarrhea, nausea and vomiting.  Genitourinary: Negative for dysuria and urgency.  Musculoskeletal: Negative for falls and myalgias.  Skin: Negative for itching and rash.  Neurological: Negative for focal weakness and seizures.  Endo/Heme/Allergies: Does not bruise/bleed easily.  Psychiatric/Behavioral: Negative for memory loss and substance abuse.    Past Medical History:  Diagnosis Date  . Arthritis   . Asthma   . Back pain   . CHF (congestive heart failure) (HCC)   . COPD (chronic obstructive pulmonary disease) (HCC)   . Coronary artery disease   . Diabetes mellitus without complication (HCC)   . Hypertension     Past Surgical History:  Procedure Laterality Date  . HERNIA REPAIR       reports that he has quit smoking. he has never used smokeless tobacco. He reports that he does not drink alcohol or use drugs.  Allergies  Allergen Reactions  . Penicillins Hives    History reviewed. No pertinent family history.  Prior to Admission medications   Medication Sig Start Date End Date Taking? Authorizing Provider  meloxicam (MOBIC) 7.5 MG tablet Take 7.5 mg by mouth daily.   Yes [provider]  NIFEdipine (PROCARDIA-XL/ADALAT CC) 60 MG 24 hr tablet Take 60 mg by mouth daily.   Yes [provider]  oxyCODONE-acetaminophen (PERCOCET/ROXICET) 5-325 MG tablet Take by mouth every 4 (four) hours as needed for severe pain.   Yes [provider]    Physical Exam:  Constitutional: Obese male in some mild discomfort Vitals:   05/05/17 0015 05/05/17 0030 05/05/17 0130 05/05/17 0207  BP: 131/72 (!) 144/72 (!) 171/92 (!) 156/73  Pulse: 92 92 100  95  Resp:    16  Temp:      TempSrc:      SpO2: 96% 94% 94% 93%  Weight:      Height:       Eyes: PERRL, lids and conjunctivae normal ENMT: Mucous membranes are dry. Posterior pharynx clear of any exudate or lesion nasogastric tube in place. Neck: normal, supple, no masses, no  thyromegaly Respiratory: Normal respiratory effort with mildly decreased aeration.  Patient able to talk in complete sentences. Cardiovascular: Regular rate and rhythm, no murmurs / rubs / gallops. No extremity edema. 2+ pedal pulses. No carotid bruits.  Abdomen: Protuberant abdomen, no tenderness at this time, no masses palpated. No hepatosplenomegaly. Bowel sounds positive.  Musculoskeletal: no clubbing / cyanosis. No joint deformity upper and lower extremities. Good ROM, no contractures. Normal muscle tone.  Skin: no rashes, lesions, ulcers. No induration Neurologic: CN 2-12 grossly intact. Sensation intact, DTR normal. Strength 5/5 in all 4.  Psychiatric: Normal judgment and insight. Alert and oriented x 3. Normal mood.     Labs on Admission: I have personally reviewed following labs and imaging studies  CBC: Recent Labs  Lab 05/04/17 2055  WBC 11.9*  HGB 14.4  HCT 43.0  MCV 83.8  PLT 520*   Basic Metabolic Panel: Recent Labs  Lab 05/04/17 2055  NA 129*  K 5.5*  CL 95*  CO2 20*  GLUCOSE 223*  BUN 31*  CREATININE 1.54*  CALCIUM 9.9   GFR: Estimated Creatinine Clearance: 58.7 mL/min (A) (by C-G formula based on SCr of 1.54 mg/dL (H)). Liver Function Tests: Recent Labs  Lab 05/04/17 2055  AST 26  ALT 12*  ALKPHOS 63  BILITOT 0.5  PROT 9.1*  ALBUMIN 4.2   Recent Labs  Lab 05/04/17 2055  LIPASE 48   No results for input(s): AMMONIA in the last 168 hours. Coagulation Profile: No results for input(s): INR, PROTIME in the last 168 hours. Cardiac Enzymes: No results for input(s): CKTOTAL, CKMB, CKMBINDEX, TROPONINI in the last 168 hours. BNP (last 3 results) No results for input(s): PROBNP in the last 8760 hours. HbA1C: No results for input(s): HGBA1C in the last 72 hours. CBG: No results for input(s): GLUCAP in the last 168 hours. Lipid Profile: No results for input(s): CHOL, HDL, LDLCALC, TRIG, CHOLHDL, LDLDIRECT in the last 72 hours. Thyroid Function  Tests: No results for input(s): TSH, T4TOTAL, FREET4, T3FREE, THYROIDAB in the last 72 hours. Anemia Panel: No results for input(s): VITAMINB12, FOLATE, FERRITIN, TIBC, IRON, RETICCTPCT in the last 72 hours. Urine analysis: No results found for: COLORURINE, APPEARANCEUR, LABSPEC, PHURINE, GLUCOSEU, HGBUR, BILIRUBINUR, KETONESUR, PROTEINUR, UROBILINOGEN, NITRITE, LEUKOCYTESUR Sepsis Labs: No results found for this or any previous visit (from the past 240 hour(s)).   Radiological Exams on Admission: Ct Abdomen Pelvis W Contrast  Result Date: 05/04/2017 CLINICAL DATA:  Nausea and vomiting. EXAM: CT ABDOMEN AND PELVIS WITH CONTRAST TECHNIQUE: Multidetector CT imaging of the abdomen and pelvis was performed using the standard protocol following bolus administration of intravenous contrast. CONTRAST:  100mL ISOVUE-300 IOPAMIDOL (ISOVUE-300) INJECTION 61% COMPARISON:  None FINDINGS: Note: Lower chest: No acute abnormality. RCA coronary artery calcifications noted. Aortic atherosclerosis identified. Hepatobiliary: No focal liver abnormality is seen. No gallstones, gallbladder wall thickening, or biliary dilatation. Pancreas: Unremarkable. No pancreatic ductal dilatation or surrounding inflammatory changes. Spleen: Normal in size without focal abnormality. Adrenals/Urinary Tract: The adrenal glands are normal. Unremarkable appearance of both kidneys. The urinary bladder appears normal. Stomach/Bowel: The stomach is distended.  Proximal small bowel loops are abnormally dilated with multiple fluid levels identified. A transition to decreased caliber small bowel loops may be within the central portion of the lower abdomen. The appendix is visualized and appears normal. Normal appearance of the colon. Vascular/Lymphatic: Aortic atherosclerosis. Infrarenal abdominal aortic ectasia measures 2.7 cm. No adenopathy. No pelvic or inguinal adenopathy. Reproductive: Prostate is unremarkable. Other: Because of patient's body  habitus the ventral abdominal wall is incompletely imaged. A ventral abdominal wall hernia cannot be excluded because this area is not fully visualized. Musculoskeletal: No acute or significant osseous findings. IMPRESSION: 1. Examination is positive for small bowel obstruction. Transition point not confidently identified but may be within the central portion of the lower abdomen. Note, that the ventral abdominal wall is incompletely visualized due to patient's body habitus. If there is a ventral abdominal wall hernia (with or without bowel loops) this may not be imaged for this reason. 2. Aortic Atherosclerosis (ICD10-I70.0). RCA coronary artery calcifications noted. 3. Ectatic abdominal aorta at risk for aneurysm development. Recommend followup by ultrasound in 5 years. This recommendation follows ACR consensus guidelines: White Paper of the ACR Incidental Findings Committee II on Vascular Findings. J Am Coll Radiol 2013; 10:789-794. Electronically Signed   By: Signa Kell M.D.   On: 05/04/2017 23:56   Dg Abd Portable 1v-small Bowel Protocol-position Verification  Result Date: 05/05/2017 CLINICAL DATA:  Status post NG tube placement EXAM: PORTABLE ABDOMEN - 1 VIEW COMPARISON:  05/04/2017 FINDINGS: Nasogastric tube tip is in the body of the stomach. The side port is well below GE junction. Multiple dilated loops of small bowel are again noted compatible with bowel obstruction. IMPRESSION: NG tube tip is in the body of the stomach. Electronically Signed   By: Signa Kell M.D.   On: 05/05/2017 01:51    EKG: Independently reviewed. Sinus rhythm at 90 bpm  Assessment/Plan Small bowel obstruction: Acute.  Patient initially presented with symptoms of nausea, vomiting, and diarrhea.  Thought to be possibly related to a viral process given recent sick contact exposure.  However CT scan of the abdomen obtained showing small bowel obstruction.  Risk factors include previous abdominal hernia with mesh  repair - Admit to MedSurg bed - Small bowel protocol initiated - N.p.o. - Continue NGT to suction - Strict I&O's - Pain control - Appreciate general surgery consultative services, will follow-up for further recommendations  Leukocytosis WBC elevated at 11.9 suspect reactive in nature. - Recheck CBC  Hyperkalemia: Acute.  Initial potassium 5.5 with no significant EKG changes noted.  Patient given IV fluids. - Recheck BMP  Suspect acute kidney injury: Baseline creatinine unknown at this time, but patient presents with a creatinine of 1.54 and BUN of 31.  BUN to creatinine ratio elevated to 20 to suggest prerenal cause.  CT scan showing no signs of obstruction. - IV fluids as tolerated - Continue to monitor kidney function  Diabetes mellitus type 2: Initial blood glucose elevated at 223. - Hypoglycemic protocols - CBG q 6hr with Sensitive SSI  Hyponatremia: Acute.  Initial sodium 129 on admission. - Recheck in a.m.  History of CHF: Patient appears clinically dry at this time. - Strict I&O's    Obesity: BMI calculated 35  History of COPD  - Duonebs as needed for wheezing/shortness of breath  DVT prophylaxis: Lovenox Code Status: Full Family Communication: No family present at bedside Disposition Plan: Likely discharge home in 2-3 days if bowel obstruction clears with conservative measures. Consults called: Surgery Admission status: inpatient  Clydie Braun MD [facility] Pager [phone]   If 7PM-7AM, please contact night-coverage [email or web address] Password [email or web address]  05/05/2017, 2:14 AM

## 2017-05-05 NOTE — Consult Note (Signed)
Re:   Steven Rubio DOB:   May 14, 1960 MRN:   [id number]  Chief Complaint Bowel obstruction  ASSESEMENT AND PLAN: 1.  SBO vs gastroenteritis  Agree with NGT, IVF, repeat KUB and examinations  2.  HTN 3.  DM x 4 years 4.  COPD/bronchitis/OSA  Has CPAP - but is claustrophobic and does not use it 5.  Morbidly obese  Weight - 324, BMI - 47  Chief Complaint  Patient presents with  . Emesis   PHYSICIAN REQUESTING CONSULTATION:  Dr. Arlyss Queen, Hospitalist HISTORY OF PRESENT ILLNESS: Steven Rubio is a 57 y.o. (DOB: 1961/02/03)  white male whose primary care physician is [facility]. His son, Steven Rubio, and sister, Steven Rubio, are at the bedside.   About 3 weeks ago the patient developed flulike symptoms with diarrhea.  This got better on its own.  He had no fever or respiratory symptoms.  Then Thursday night, January 17, he developed diarrhea and vomiting.  It looks like he went to med [facility] where he was evaluated.  A CT scan suggested possible bowel obstruction.  He was transferred to admission to [facility].      His only prior abdominal surgery was an open umbilical hernia repair in the [place]  in 2007.  He had a left hydrocele removed in 2013 in the [place].  He was evaluated for possible esophageal cancer, but apparently has severe gastroesophageal reflux disease.  He has no liver, gallbladder, or colon disease.  CT scan of abdomen - 05/05/2017 - 1. Examination is positive for small bowel obstruction. Transition point not confidently identified but may be within the central portion of the lower abdomen. Note, that the ventral abdominal wall is incompletely visualized due to patient's body habitus. If there is a ventral abdominal wall hernia (with or without bowel loops) this may not be imaged for this reason.   2. Aortic Atherosclerosis (ICD10-I70.0). RCA coronary artery calcifications noted.  3. Ectatic abdominal aorta at risk for aneurysm development.   Recommend followup by ultrasound in 5 years.     Past Medical History:  Diagnosis Date  . Arthritis   . Asthma   . Back pain   . CHF (congestive heart failure) (HCC)   . COPD (chronic obstructive pulmonary disease) (HCC)   . Coronary artery disease   . Diabetes mellitus without complication (HCC)   . Hypertension       Past Surgical History:  Procedure Laterality Date  . HERNIA REPAIR        Current Facility-Administered Medications  Medication Dose Route Frequency Provider Last Rate Last Dose  . 0.9 %  sodium chloride infusion   Intravenous Continuous Madelyn Flavors A, MD 100 mL/hr at 05/05/17 0444    . acetaminophen (TYLENOL) suppository 650 mg  650 mg Rectal Q6H PRN Karie Soda, MD      . alum & mag hydroxide-simeth (MAALOX/MYLANTA) 200-200-20 MG/5ML suspension 30 mL  30 mL Oral Q6H PRN Karie Soda, MD      . bisacodyl (DULCOLAX) suppository 10 mg  10 mg Rectal Daily Karie Soda, MD      . diatrizoate meglumine-sodium (GASTROGRAFIN) 66-10 % solution 90 mL  90 mL Per NG tube Once Karie Soda, MD      . diphenhydrAMINE (BENADRYL) injection 12.5-25 mg  12.5-25 mg Intravenous Q6H PRN Karie Soda, MD      . enoxaparin (LOVENOX) injection 40 mg  40 mg Subcutaneous Q24H Clydie Braun, MD  40 mg at 05/05/17 1028  . guaiFENesin-dextromethorphan (ROBITUSSIN DM) 100-10 MG/5ML syrup 10 mL  10 mL Oral Q4H PRN Karie Soda, MD      . hydrALAZINE (APRESOLINE) injection 10 mg  10 mg Intravenous Q4H PRN Smith, Rondell A, MD      . hydrocortisone (ANUSOL-HC) 2.5 % rectal cream 1 application  1 application Topical QID PRN Karie Soda, MD      . hydrocortisone cream 1 % 1 application  1 application Topical TID PRN Karie Soda, MD      . HYDROmorphone (DILAUDID) injection 1 mg  1 mg Intravenous Q2H PRN Ward, Kristen N, DO   1 mg at 05/05/17 1425  . insulin aspart (novoLOG) injection 0-9 Units  0-9 Units Subcutaneous Q4H Madelyn Flavors A, MD   2 Units at 05/05/17 0800  .  ipratropium-albuterol (DUONEB) 0.5-2.5 (3) MG/3ML nebulizer solution 3 mL  3 mL Nebulization Q4H PRN Smith, Rondell A, MD      . lactated ringers bolus 1,000 mL  1,000 mL Intravenous Once Karie Soda, MD 1,000 mL/hr at 05/05/17 1428 1,000 mL at 05/05/17 1428  . lactated ringers bolus 1,000 mL  1,000 mL Intravenous Q8H PRN Karie Soda, MD      . lip balm (CARMEX) ointment 1 application  1 application Topical BID Karie Soda, MD      . magic mouthwash  15 mL Oral QID PRN Karie Soda, MD      . menthol-cetylpyridinium (CEPACOL) lozenge 3 mg  1 lozenge Oral PRN Karie Soda, MD      . metoCLOPramide (REGLAN) injection 5-10 mg  5-10 mg Intravenous Q6H PRN Karie Soda, MD      . ondansetron [facility]) injection 4 mg  4 mg Intravenous Q6H PRN Karie Soda, MD       Or  . ondansetron (ZOFRAN) 8 mg in sodium chloride 0.9 % 50 mL IVPB  8 mg Intravenous Q6H PRN Karie Soda, MD      . pantoprazole (PROTONIX) injection 40 mg  40 mg Intravenous Q12H Smith, Rondell A, MD      . phenol (CHLORASEPTIC) mouth spray 1-2 spray  1-2 spray Mouth/Throat PRN Karie Soda, MD      . prochlorperazine (COMPAZINE) injection 5-10 mg  5-10 mg Intravenous Q4H PRN Karie Soda, MD          Allergies  Allergen Reactions  . Penicillins Hives    REVIEW OF SYSTEMS: Skin:  No history of rash.  No history of abnormal moles. Infection:  No history of hepatitis or HIV.  No history of MRSA. Neurologic:  No history of stroke.  No history of seizure.  No history of headaches. Cardiac:  HTN  No history of heart disease.  No history of prior cardiac catheterization.  No history of seeing a cardiologist. Pulmonary:  COPD.  Can't use CPAP because of claustrophobia.  Endocrine:  DM No thyroid disease. Gastrointestinal:  See HPI. Urologic:  History of left hydrocele removal.  [facility] in 2013.  No history of kidney stones.  No history of bladder infections. Musculoskeletal:  No history of joint or back  disease. Hematologic:  No bleeding disorder.  No history of anemia.  Not anticoagulated. Psycho-social:  The patient is oriented.   The patient has no obvious psychologic or social impairment to understanding our conversation and plan.  SOCIAL and FAMILY HISTORY: Separated.  Lives by self. His son, Steven Rubio, and sister, Steven Rubio, are at the bedside. He has one other son.  PHYSICAL EXAM:  BP (!) 154/66 (BP Location: Right Arm)   Pulse 93   Temp 97.8 F (36.6 C) (Oral)   Resp 18   Ht 5\' 6"  (1.676 m)   Wt 102.5 kg (225 lb 15.5 oz)   SpO2 96%   BMI 36.47 kg/m   General: Obese WM who is alert and generally healthy appearing.  Skin:  Inspection and palpation - no mass or rash. Eyes:  Conjunctiva and lids unremarkable.            Pupils are equal Ears, Nose, Mouth, and Throat:  Ears and nose unremarkable            Lips and teeth are unremarable. Neck: Supple. No mass, trachea midline.  No thyroid mass.  Thick neck. Lymph Nodes:  No supraclavicular, cervical, or inguinal nodes. Lungs: Normal respiratory effort.  Clear to auscultation and symmetric breath sounds. Heart:  Palpation of the heart is normal.            Auscultation: RRR. No murmur or rub.  Abdomen: Very large abdomen.  No mass. No tenderness. No hernia.             Has BS.  Has scar above the umbilicus from prior umbilical hernia repair. Rectal: Not done. Musculoskeletal:  Good muscle strength and ROM  in upper and lower extremities. Neurologic:  Grossly intact to motor and sensory function. Psychiatric: Normal judgement and insight. Behavior is normal.            Oriented to time, person, place.   DATA REVIEWED, COUNSELING AND COORDINATION OF CARE: Epic notes reviewed. Counseling and coordination of care exceeded more than 50% of the time spent with patient. Total time spent with patient and charting: 45  Ovidio Kinavid Liara Holm, MD,  [facility], [place] [address]  [address]   [place], [place]     [id number] Phone:  [phone] FAX:  [phone]

## 2017-05-06 ENCOUNTER — Encounter (HOSPITAL_COMMUNITY): Payer: Self-pay | Admitting: Surgery

## 2017-05-06 LAB — HIV ANTIBODY (ROUTINE TESTING W REFLEX): HIV Screen 4th Generation wRfx: NONREACTIVE

## 2017-05-06 LAB — GLUCOSE, CAPILLARY
GLUCOSE-CAPILLARY: 132 mg/dL — AB (ref 65–99)
GLUCOSE-CAPILLARY: 124 mg/dL — AB (ref 65–99)
GLUCOSE-CAPILLARY: 143 mg/dL — AB (ref 65–99)
Glucose-Capillary: 101 mg/dL — ABNORMAL HIGH (ref 65–99)
Glucose-Capillary: 116 mg/dL — ABNORMAL HIGH (ref 65–99)
Glucose-Capillary: 126 mg/dL — ABNORMAL HIGH (ref 65–99)

## 2017-05-06 MED ORDER — LISINOPRIL-HYDROCHLOROTHIAZIDE 20-25 MG PO TABS: 1.0000 | ORAL_TABLET | Freq: Every day | ORAL | Status: DC

## 2017-05-06 MED ORDER — INSULIN ASPART 100 UNIT/ML ~~LOC~~ SOLN
0.0000 [IU] | Freq: Three times a day (TID) | SUBCUTANEOUS | Status: DC
  Administered 2017-05-06: 2 [IU] via SUBCUTANEOUS
  Administered 2017-05-07: 3 [IU] via SUBCUTANEOUS

## 2017-05-06 MED ORDER — GABAPENTIN 300 MG PO CAPS
300.0000 mg | ORAL_CAPSULE | Freq: Three times a day (TID) | ORAL | Status: DC
  Administered 2017-05-06 – 2017-05-07 (×4): 300 mg via ORAL
  Filled 2017-05-06 (×4): qty 1

## 2017-05-06 MED ORDER — INSULIN ASPART 100 UNIT/ML ~~LOC~~ SOLN
3.0000 [IU] | Freq: Three times a day (TID) | SUBCUTANEOUS | Status: DC
  Administered 2017-05-06 – 2017-05-07 (×3): 3 [IU] via SUBCUTANEOUS

## 2017-05-06 MED ORDER — AMLODIPINE BESYLATE 5 MG PO TABS
5.0000 mg | ORAL_TABLET | Freq: Every day | ORAL | Status: DC
  Administered 2017-05-06: 5 mg via ORAL
  Filled 2017-05-06: qty 1

## 2017-05-06 MED ORDER — HYDROCHLOROTHIAZIDE 25 MG PO TABS
25.0000 mg | ORAL_TABLET | Freq: Every day | ORAL | Status: DC
  Administered 2017-05-06 – 2017-05-07 (×2): 25 mg via ORAL
  Filled 2017-05-06 (×2): qty 1

## 2017-05-06 MED ORDER — OXYCODONE HCL 5 MG PO TABS
5.0000 mg | ORAL_TABLET | Freq: Four times a day (QID) | ORAL | Status: DC | PRN
  Administered 2017-05-07 (×2): 5 mg via ORAL
  Filled 2017-05-06 (×2): qty 1

## 2017-05-06 MED ORDER — ATENOLOL 50 MG PO TABS
50.0000 mg | ORAL_TABLET | Freq: Every day | ORAL | Status: DC
  Administered 2017-05-06 – 2017-05-07 (×2): 50 mg via ORAL
  Filled 2017-05-06 (×2): qty 1

## 2017-05-06 MED ORDER — INSULIN ASPART 100 UNIT/ML ~~LOC~~ SOLN: 0.0000 [IU] | Freq: Every day | SUBCUTANEOUS | Status: DC

## 2017-05-06 MED ORDER — GEMFIBROZIL 600 MG PO TABS
600.0000 mg | ORAL_TABLET | Freq: Two times a day (BID) | ORAL | Status: DC
  Administered 2017-05-06 – 2017-05-07 (×3): 600 mg via ORAL
  Filled 2017-05-06 (×4): qty 1

## 2017-05-06 MED ORDER — METFORMIN HCL 500 MG PO TABS
500.0000 mg | ORAL_TABLET | Freq: Every day | ORAL | Status: DC
  Administered 2017-05-06 – 2017-05-07 (×2): 500 mg via ORAL
  Filled 2017-05-06 (×2): qty 1

## 2017-05-06 MED ORDER — ASPIRIN EC 81 MG PO TBEC
81.0000 mg | DELAYED_RELEASE_TABLET | Freq: Every day | ORAL | Status: DC
  Administered 2017-05-06: 81 mg via ORAL
  Filled 2017-05-06: qty 1

## 2017-05-06 MED ORDER — LISINOPRIL 20 MG PO TABS
20.0000 mg | ORAL_TABLET | Freq: Every day | ORAL | Status: DC
  Administered 2017-05-06 – 2017-05-07 (×2): 20 mg via ORAL
  Filled 2017-05-06 (×2): qty 1

## 2017-05-06 MED ORDER — SERTRALINE HCL 100 MG PO TABS
100.0000 mg | ORAL_TABLET | Freq: Every morning | ORAL | Status: DC
  Administered 2017-05-06 – 2017-05-07 (×2): 100 mg via ORAL
  Filled 2017-05-06 (×2): qty 1

## 2017-05-06 NOTE — Progress Notes (Signed)
[facility] PROGRESS NOTE    Progress Note  Steven Rubio  ZOX:[id number] DOB: 07/12/1960 DOA: 05/04/2017 PCP: [facility]     Brief Narrative:   Steven Rubio is an 57 y.o. male past medical history significant for essential hypertension, diabetes mellitus and COPD remote history of tobacco abuse that comes into the hospital for 2 days of nausea vomiting and diarrhea he reports having 6-7 bouts of emesis nonbloody, and 3-2 watery bowel movement.  He denies any abdominal pain in the ED, CT scan show sign of small bowel obstruction surgery was consulted who recommended conservative management.  Assessment/Plan:   SBO (small bowel obstruction) (HCC) General surgery was consulted who recommended conservative management.  Now resolved. DC NG tube, advance diet Continues to ambulate.  Leucytosis: Resolved.  Acute kidney injury unknown baseline: Resolved with IV fluid hydration likely prerenal in etiology.  Hyperkalemia: No changes on EKG he was started on IV fluids. Recheck basic metabolic panel.  Diabetes mellitus type 2 in obese [facility] Allowing a regular diet, start metformin. Change sliding scale insulin with meal coverage.  Hypovolemic hyponatremia: Likely due to hypovolemia, resolved with IV fluid hydration.  History of heart failure: I do not see any previous echoes documented continue strict I's and O's   DVT prophylaxis: lovenox Family Communication:none Disposition Plan/Barrier to D/C: home in the morning Code Status:     Code Status Orders  (From admission, onward)        Start     Ordered   05/05/17 0348  Full code  Continuous     05/05/17 0347    Code Status History    Date Active Date Inactive Code Status Order ID Comments User Context   This patient has a current code status but no historical code status.        IV Access:    Peripheral IV   Procedures and diagnostic studies:   Ct Abdomen Pelvis W Contrast  Result Date:  05/04/2017 CLINICAL DATA:  Nausea and vomiting. EXAM: CT ABDOMEN AND PELVIS WITH CONTRAST TECHNIQUE: Multidetector CT imaging of the abdomen and pelvis was performed using the standard protocol following bolus administration of intravenous contrast. CONTRAST:  ISOVUE-300 IOPAMIDOL (ISOVUE-300) INJECTION 61% COMPARISON:  None FINDINGS: Note: Lower chest: No acute abnormality. RCA coronary artery calcifications noted. Aortic atherosclerosis identified. Hepatobiliary: No focal liver abnormality is seen. No gallstones, gallbladder wall thickening, or biliary dilatation. Pancreas: Unremarkable. No pancreatic ductal dilatation or surrounding inflammatory changes. Spleen: Normal in size without focal abnormality. Adrenals/Urinary Tract: The adrenal glands are normal. Unremarkable appearance of both kidneys. The urinary bladder appears normal. Stomach/Bowel: The stomach is distended. Proximal small bowel loops are abnormally dilated with multiple fluid levels identified. A transition to decreased caliber small bowel loops may be within the central portion of the lower abdomen. The appendix is visualized and appears normal. Normal appearance of the colon. Vascular/Lymphatic: Aortic atherosclerosis. Infrarenal abdominal aortic ectasia measures 2.7 cm. No adenopathy. No pelvic or inguinal adenopathy. Reproductive: Prostate is unremarkable. Other: Because of patient's body habitus the ventral abdominal wall is incompletely imaged. A ventral abdominal wall hernia cannot be excluded because this area is not fully visualized. Musculoskeletal: No acute or significant osseous findings. IMPRESSION: 1. Examination is positive for small bowel obstruction. Transition point not confidently identified but may be within the central portion of the lower abdomen. Note, that the ventral abdominal wall is incompletely visualized due to patient's body habitus. If there is a ventral abdominal wall hernia (with  or without bowel loops) this  may not be imaged for this reason. 2. Aortic Atherosclerosis (ICD10-I70.0). RCA coronary artery calcifications noted. 3. Ectatic abdominal aorta at risk for aneurysm development. Recommend followup by ultrasound in 5 years. This recommendation follows ACR consensus guidelines: White Paper of the ACR Incidental Findings Committee II on Vascular Findings. J Am Coll Radiol 2013; 10:789-794. Electronically Signed   By: Signa Kell M.D.   On: 05/04/2017 23:56   Dg Abd Portable 1v-small Bowel Obstruction Protocol-initial, 8 Hr Delay  Result Date: 05/05/2017 CLINICAL DATA:  Small bowel obstruction EXAM: PORTABLE ABDOMEN - 1 VIEW COMPARISON:  Plain film 05/05/2017, CT 05/04/2017 FINDINGS: NG tube in stomach. Dilated loops of small bowel identified on comparison radiograph are less prominent. Contrast appears to have progressed to the rectum. Exam difficult to interpret due to body habitus. IMPRESSION: Small bowel obstruction pattern appears slightly improved Exam limited by body habitus. Electronically Signed   By: Genevive Bi M.D.   On: 05/05/2017 20:48   Dg Abd Portable 1v-small Bowel Protocol-position Verification  Result Date: 05/05/2017 CLINICAL DATA:  Status post NG tube placement EXAM: PORTABLE ABDOMEN - 1 VIEW COMPARISON:  05/04/2017 FINDINGS: Nasogastric tube tip is in the body of the stomach. The side port is well below GE junction. Multiple dilated loops of small bowel are again noted compatible with bowel obstruction. IMPRESSION: NG tube tip is in the body of the stomach. Electronically Signed   By: Signa Kell M.D.   On: 05/05/2017 01:51     Medical Consultants:    None.  Anti-Infectives:   None  Subjective:    Steven Rubio has had multiple bowel movements.  Objective:    Vitals:   05/05/17 0400 05/05/17 1309 05/05/17 2044 05/06/17 0424  BP: (!) 170/84 (!) 154/66 (!) 147/73 (!) 147/70  Pulse: 96 93 90 (!) 105  Resp: 16 18 16 14   Temp: 98.2 F (36.8 C) 97.8 F (36.6  C) 97.9 F (36.6 C) 99.1 F (37.3 C)  TempSrc: Oral Oral Oral Oral  SpO2: 97% 96% 94% 97%  Weight: 102.5 kg (225 lb 15.5 oz)     Height: 5\' 6"  (1.676 m)       Intake/Output Summary (Last 24 hours) at 05/06/2017 0919 Last data filed at 05/06/2017 0800 Gross per 24 hour  Intake 2560 ml  Output 2200 ml  Net 360 ml   Filed Weights   05/04/17 2024 05/05/17 0400  Weight: 101.6 kg (224 lb) 102.5 kg (225 lb 15.5 oz)    Exam: General exam: In no acute distress. Respiratory system: Good air movement and clear to auscultation. Cardiovascular system: S1 & S2 heard, RRR. No JVD. Gastrointestinal system: Continues to be massively distended, positive bowel sounds nontender Central nervous system: Alert and oriented. No focal neurological deficits. Extremities: No pedal edema. Skin: No rashes, lesions or ulcers Psychiatry: Judgement and insight appear normal. Mood & affect appropriate.    Data Reviewed:    Labs: Basic Metabolic Panel: Recent Labs  Lab 05/04/17 2055 05/05/17 0835  NA 129* 130*  K 5.5* 4.9  CL 95* 95*  CO2 20* 25  GLUCOSE 223* 145*  BUN 31* 29*  CREATININE 1.54* 1.16  CALCIUM 9.9 9.5   GFR Estimated Creatinine Clearance: 76.9 mL/min (by C-G formula based on SCr of 1.16 mg/dL). Liver Function Tests: Recent Labs  Lab 05/04/17 2055  AST 26  ALT 12*  ALKPHOS 63  BILITOT 0.5  PROT 9.1*  ALBUMIN 4.2   Recent Labs  Lab 05/04/17 2055  LIPASE 48   No results for input(s): AMMONIA in the last 168 hours. Coagulation profile No results for input(s): INR, PROTIME in the last 168 hours.  CBC: Recent Labs  Lab 05/04/17 2055 05/05/17 0835  WBC 11.9* 9.9  NEUTROABS  --  6.1  HGB 14.4 13.5  HCT 43.0 39.8  MCV 83.8 84.5  PLT 520* 429*   Cardiac Enzymes: No results for input(s): CKTOTAL, CKMB, CKMBINDEX, TROPONINI in the last 168 hours. BNP (last 3 results) No results for input(s): PROBNP in the last 8760 hours. CBG: Recent Labs  Lab 05/05/17 1715  05/05/17 2135 05/06/17 0019 05/06/17 0558 05/06/17 0738  GLUCAP 120* 115* 126* 132* 124*   D-Dimer: No results for input(s): DDIMER in the last 72 hours. Hgb A1c: No results for input(s): HGBA1C in the last 72 hours. Lipid Profile: No results for input(s): CHOL, HDL, LDLCALC, TRIG, CHOLHDL, LDLDIRECT in the last 72 hours. Thyroid function studies: No results for input(s): TSH, T4TOTAL, T3FREE, THYROIDAB in the last 72 hours.  Invalid input(s): FREET3 Anemia work up: No results for input(s): VITAMINB12, FOLATE, FERRITIN, TIBC, IRON, RETICCTPCT in the last 72 hours. Sepsis Labs: Recent Labs  Lab 05/04/17 2055 05/05/17 0835  WBC 11.9* 9.9   Microbiology No results found for this or any previous visit (from the past 240 hour(s)).   Medications:   . bisacodyl  10 mg Rectal Daily  . diatrizoate meglumine-sodium  90 mL Per NG tube Once  . enoxaparin (LOVENOX) injection  40 mg Subcutaneous Q24H  . insulin aspart  0-9 Units Subcutaneous Q4H  . lip balm  1 application Topical BID  . pantoprazole (PROTONIX) IV  40 mg Intravenous Q12H   Continuous Infusions: . sodium chloride 100 mL/hr at 05/06/17 0620  . lactated ringers    . ondansetron (ZOFRAN) IV       LOS: 1 day   Marinda Elkbraham Feliz Ortiz  [facility] Pager [phone]  *Please refer to [email or web address], password [email or web address] to get updated schedule on who will round on this patient, as hospitalists switch teams weekly. If 7PM-7AM, please contact night-coverage at [email or web address], password [email or web address] for any overnight needs.  05/06/2017, 9:19 AM

## 2017-05-06 NOTE — Progress Notes (Signed)
[facility] Office:  [phone] General Surgery Progress Note   LOS: 1 day  POD -     Chief Complaint: Bowel obstruction  Assessment and Plan: 1.  SBO vs gastroenteritis - improved      Contrast appears in the rectum on the KUB last PM.    On full liquids diet and doing okay           Should continue to improve ... No plans for surgery.  Will sign off.       2.  HTN 3.  DM x 4 years 4.  COPD/bronchitis/OSA             Has CPAP - but is claustrophobic and does not use it 5.  Morbidly obese             Weight - 324, BMI - 47 6. DVT prophylaxis - Lovenox   Principal Problem:   SBO (small bowel obstruction) (HCC) Active Problems:   COPD (chronic obstructive pulmonary disease) (HCC)   Diabetes mellitus type 2 in obese (HCC)   Leukocytosis   Hyperkalemia   History of CHF (congestive heart failure)  Subjective:  Doing much better.  Has had 2 small and one larger BM's today.  Tolerating full liquids.  Objective:   Vitals:   05/05/17 2044 05/06/17 0424  BP: (!) 147/73 (!) 147/70  Pulse: 90 (!) 105  Resp: 16 14  Temp: 97.9 F (36.6 C) 99.1 F (37.3 C)  SpO2: 94% 97%     Intake/Output from previous day:  01/19 0701 - 01/20 0700 In: 2320 [P.O.:120; I.V.:1200; IV Piggyback:1000] Out: 1600 [Urine:600; Emesis/NG output:1000]  Intake/Output this shift:  Total I/O In: 480 [P.O.:480] Out: 600 [Urine:600]   Physical Exam:   General: Obese WM who is alert and oriented.    HEENT: Normal. Pupils equal. .   Lungs: Clear   Abdomen: Large, but softer.  Has BS.   Lab Results:    Recent Labs    05/04/17 2055 05/05/17 0835  WBC 11.9* 9.9  HGB 14.4 13.5  HCT 43.0 39.8  PLT 520* 429*    BMET   Recent Labs    05/04/17 2055 05/05/17 0835  NA 129* 130*  K 5.5* 4.9  CL 95* 95*  CO2 20* 25  GLUCOSE 223* 145*  BUN 31* 29*  CREATININE 1.54* 1.16  CALCIUM 9.9 9.5    PT/INR  No results for input(s): LABPROT, INR in the last 72 hours.  ABG  No  results for input(s): PHART, HCO3 in the last 72 hours.  Invalid input(s): PCO2, PO2   Studies/Results:  Ct Abdomen Pelvis W Contrast  Result Date: 05/04/2017 CLINICAL DATA:  Nausea and vomiting. EXAM: CT ABDOMEN AND PELVIS WITH CONTRAST TECHNIQUE: Multidetector CT imaging of the abdomen and pelvis was performed using the standard protocol following bolus administration of intravenous contrast. CONTRAST:  ISOVUE-300 IOPAMIDOL (ISOVUE-300) INJECTION 61% COMPARISON:  None FINDINGS: Note: Lower chest: No acute abnormality. RCA coronary artery calcifications noted. Aortic atherosclerosis identified. Hepatobiliary: No focal liver abnormality is seen. No gallstones, gallbladder wall thickening, or biliary dilatation. Pancreas: Unremarkable. No pancreatic ductal dilatation or surrounding inflammatory changes. Spleen: Normal in size without focal abnormality. Adrenals/Urinary Tract: The adrenal glands are normal. Unremarkable appearance of both kidneys. The urinary bladder appears normal. Stomach/Bowel: The stomach is distended. Proximal small bowel loops are abnormally dilated with multiple fluid levels identified. A transition to decreased caliber small bowel loops may be within the central portion of the  lower abdomen. The appendix is visualized and appears normal. Normal appearance of the colon. Vascular/Lymphatic: Aortic atherosclerosis. Infrarenal abdominal aortic ectasia measures 2.7 cm. No adenopathy. No pelvic or inguinal adenopathy. Reproductive: Prostate is unremarkable. Other: Because of patient's body habitus the ventral abdominal wall is incompletely imaged. A ventral abdominal wall hernia cannot be excluded because this area is not fully visualized. Musculoskeletal: No acute or significant osseous findings. IMPRESSION: 1. Examination is positive for small bowel obstruction. Transition point not confidently identified but may be within the central portion of the lower abdomen. Note, that the  ventral abdominal wall is incompletely visualized due to patient's body habitus. If there is a ventral abdominal wall hernia (with or without bowel loops) this may not be imaged for this reason. 2. Aortic Atherosclerosis (ICD10-I70.0). RCA coronary artery calcifications noted. 3. Ectatic abdominal aorta at risk for aneurysm development. Recommend followup by ultrasound in 5 years. This recommendation follows ACR consensus guidelines: White Paper of the ACR Incidental Findings Committee II on Vascular Findings. J Am Coll Radiol 2013; 10:789-794. Electronically Signed   By: Signa Kellaylor  Stroud M.D.   On: 05/04/2017 23:56   Dg Abd Portable 1v-small Bowel Obstruction Protocol-initial, 8 Hr Delay  Result Date: 05/05/2017 CLINICAL DATA:  Small bowel obstruction EXAM: PORTABLE ABDOMEN - 1 VIEW COMPARISON:  Plain film 05/05/2017, CT 05/04/2017 FINDINGS: NG tube in stomach. Dilated loops of small bowel identified on comparison radiograph are less prominent. Contrast appears to have progressed to the rectum. Exam difficult to interpret due to body habitus. IMPRESSION: Small bowel obstruction pattern appears slightly improved Exam limited by body habitus. Electronically Signed   By: Genevive BiStewart  Edmunds M.D.   On: 05/05/2017 20:48   Dg Abd Portable 1v-small Bowel Protocol-position Verification  Result Date: 05/05/2017 CLINICAL DATA:  Status post NG tube placement EXAM: PORTABLE ABDOMEN - 1 VIEW COMPARISON:  05/04/2017 FINDINGS: Nasogastric tube tip is in the body of the stomach. The side port is well below GE junction. Multiple dilated loops of small bowel are again noted compatible with bowel obstruction. IMPRESSION: NG tube tip is in the body of the stomach. Electronically Signed   By: Signa Kellaylor  Stroud M.D.   On: 05/05/2017 01:51     Anti-infectives:   Anti-infectives (From admission, onward)   None      Ovidio Kinavid Oona Trammel, MD, FACS Pager: [phone] [facility]: [phone] 05/06/2017

## 2017-05-07 LAB — GLUCOSE, CAPILLARY: GLUCOSE-CAPILLARY: 196 mg/dL — AB (ref 65–99)

## 2017-05-07 NOTE — Discharge Summary (Signed)
Physician Discharge Summary  Steven DalesHomer J Rubio WGN:[id number] DOB: [birth year] DOA: 05/04/2017  PCP: [facility]  Admit date: 05/04/2017 Discharge date: 05/07/2017  Admitted From: Home Disposition:  Home  Recommendations for Outpatient Follow-up:  1. Follow up with PCP in 1-2 weeks 2. Please obtain BMP/CBC in one week   Home Health:No Equipment/Devices:home  Discharge Condition:stable CODE STATUS:full Diet recommendation: Heart Healthy   Brief/Interim Summary: 57 y.o. male past medical history significant for essential hypertension, diabetes mellitus and COPD remote history of tobacco abuse that comes into the hospital for 2 days of nausea vomiting and diarrhea he reports having 6-7 bouts of emesis nonbloody, and 3-2 watery bowel movement.  He denies any abdominal pain in the ED, CT scan show sign of small bowel obstruction surgery was consulted who recommended conservative management.    Discharge Diagnoses:  Principal Problem:   SBO (small bowel obstruction) (HCC) Active Problems:   COPD (chronic obstructive pulmonary disease) (HCC)   Diabetes mellitus type 2 in obese (HCC)   Leukocytosis   Hyperkalemia   History of CHF (congestive heart failure)  Small bowel obstruction: General surgery was consulted recommended conservative management NG tube was placed place and n.p.o. by the next day he was passing gas NG tube was DC'd he tolerated his diet.  LeuCytosis: Likely reactive due to small bowel obstruction now resolved he remained afebrile.  Acute kidney injury unknown baseline: Resolved with IV fluid likely prerenal in etiology.  Hyperkalemia: EKG was done showed no changes to resolve with IV fluid hydration likely due to acute renal failure.  Diabetes mellitus type 2: No changes were made to him his medication.  Next  Hypovolemic hyponatremia: This resolved with IV fluid hydration.  History of heart failure: No echo in system no changes were made to his  medication.  Discharge Instructions  Discharge Instructions    Diet - low sodium heart healthy   Complete by:  As directed    Increase activity slowly   Complete by:  As directed      Allergies as of 05/07/2017      Reactions   Penicillins Hives   Has patient had a PCN reaction causing immediate rash, facial/tongue/throat swelling, SOB or lightheadedness with hypotension: No Has patient had a PCN reaction causing severe rash involving mucus membranes or skin necrosis: No Has patient had a PCN reaction that required hospitalization: No Has patient had a PCN reaction occurring within the last 10 years: No If all of the above answers are "NO", then may proceed with Cephalosporin use.      Medication List    TAKE these medications   acetaminophen 500 MG tablet Commonly known as:  TYLENOL Take 500 mg by mouth every 8 (eight) hours as needed for mild pain or moderate pain.   albuterol 108 (90 Base) MCG/ACT inhaler Commonly known as:  PROVENTIL HFA;VENTOLIN HFA Inhale 1-2 puffs into the lungs every 6 (six) hours as needed for wheezing or shortness of breath.   amLODipine 10 MG tablet Commonly known as:  NORVASC Take 5 mg by mouth at bedtime.   ARTIFICIAL TEAR OP Place 1 drop into both eyes every 6 (six) hours as needed (dry eyes).   aspirin EC 81 MG tablet Take 81 mg by mouth at bedtime.   atenolol 50 MG tablet Commonly known as:  TENORMIN Take 50 mg by mouth daily.   cetirizine 10 MG tablet Commonly known as:  ZYRTEC Take 10 mg by mouth daily as needed for allergies.  cholecalciferol 1000 units tablet Commonly known as:  VITAMIN D Take 1,000 Units by mouth at bedtime.   Fish Oil 1000 MG Caps Take 2,000 mg by mouth 3 (three) times daily.   fluticasone 50 MCG/ACT nasal spray Commonly known as:  FLONASE Place 1 spray into both nostrils daily as needed for allergies or rhinitis.   gabapentin 300 MG capsule Commonly known as:  NEURONTIN Take 300 mg by mouth 3  (three) times daily.   gemfibrozil 600 MG tablet Commonly known as:  LOPID Take 600 mg by mouth 2 (two) times daily.   lisinopril-hydrochlorothiazide 20-25 MG tablet Commonly known as:  PRINZIDE,ZESTORETIC Take 1 tablet by mouth daily.   loperamide 2 MG tablet Commonly known as:  IMODIUM A-D Take 2 mg by mouth 4 (four) times daily as needed for diarrhea or loose stools.   meloxicam 7.5 MG tablet Commonly known as:  MOBIC Take 7.5 mg by mouth at bedtime.   metFORMIN 500 MG tablet Commonly known as:  GLUCOPHAGE Take 500 mg by mouth daily with breakfast.   oxyCODONE 5 MG immediate release tablet Commonly known as:  Oxy IR/ROXICODONE Take 5 mg by mouth every 6 (six) hours as needed for severe pain.   pantoprazole 40 MG tablet Commonly known as:  PROTONIX Take 40 mg by mouth 2 (two) times daily before a meal.   ranitidine 300 MG tablet Commonly known as:  ZANTAC Take 300 mg by mouth at bedtime.   sertraline 100 MG tablet Commonly known as:  ZOLOFT Take 100 mg by mouth every morning.   sildenafil 100 MG tablet Commonly known as:  VIAGRA Take 100 mg by mouth daily as needed for erectile dysfunction.   vitamin B-12 500 MCG tablet Commonly known as:  CYANOCOBALAMIN Take 500 mcg by mouth daily.       Allergies  Allergen Reactions  . Penicillins Hives    Has patient had a PCN reaction causing immediate rash, facial/tongue/throat swelling, SOB or lightheadedness with hypotension: No Has patient had a PCN reaction causing severe rash involving mucus membranes or skin necrosis: No Has patient had a PCN reaction that required hospitalization: No Has patient had a PCN reaction occurring within the last 10 years: No If all of the above answers are "NO", then may proceed with Cephalosporin use.     Consultations:  General surgery   Procedures/Studies: Ct Abdomen Pelvis W Contrast  Result Date: 05/04/2017 CLINICAL DATA:  Nausea and vomiting. EXAM: CT ABDOMEN AND  PELVIS WITH CONTRAST TECHNIQUE: Multidetector CT imaging of the abdomen and pelvis was performed using the standard protocol following bolus administration of intravenous contrast. CONTRAST:  ISOVUE-300 IOPAMIDOL (ISOVUE-300) INJECTION 61% COMPARISON:  None FINDINGS: Note: Lower chest: No acute abnormality. RCA coronary artery calcifications noted. Aortic atherosclerosis identified. Hepatobiliary: No focal liver abnormality is seen. No gallstones, gallbladder wall thickening, or biliary dilatation. Pancreas: Unremarkable. No pancreatic ductal dilatation or surrounding inflammatory changes. Spleen: Normal in size without focal abnormality. Adrenals/Urinary Tract: The adrenal glands are normal. Unremarkable appearance of both kidneys. The urinary bladder appears normal. Stomach/Bowel: The stomach is distended. Proximal small bowel loops are abnormally dilated with multiple fluid levels identified. A transition to decreased caliber small bowel loops may be within the central portion of the lower abdomen. The appendix is visualized and appears normal. Normal appearance of the colon. Vascular/Lymphatic: Aortic atherosclerosis. Infrarenal abdominal aortic ectasia measures 2.7 cm. No adenopathy. No pelvic or inguinal adenopathy. Reproductive: Prostate is unremarkable. Other: Because of patient's body habitus the  ventral abdominal wall is incompletely imaged. A ventral abdominal wall hernia cannot be excluded because this area is not fully visualized. Musculoskeletal: No acute or significant osseous findings. IMPRESSION: 1. Examination is positive for small bowel obstruction. Transition point not confidently identified but may be within the central portion of the lower abdomen. Note, that the ventral abdominal wall is incompletely visualized due to patient's body habitus. If there is a ventral abdominal wall hernia (with or without bowel loops) this may not be imaged for this reason. 2. Aortic Atherosclerosis  (ICD10-I70.0). RCA coronary artery calcifications noted. 3. Ectatic abdominal aorta at risk for aneurysm development. Recommend followup by ultrasound in 5 years. This recommendation follows ACR consensus guidelines: White Paper of the ACR Incidental Findings Committee II on Vascular Findings. J Am Coll Radiol 2013; 10:789-794. Electronically Signed   By: Signa Kell M.D.   On: 05/04/2017 23:56   Dg Abd Portable 1v-small Bowel Obstruction Protocol-initial, 8 Hr Delay  Result Date: 05/05/2017 CLINICAL DATA:  Small bowel obstruction EXAM: PORTABLE ABDOMEN - 1 VIEW COMPARISON:  Plain film 05/05/2017, CT 05/04/2017 FINDINGS: NG tube in stomach. Dilated loops of small bowel identified on comparison radiograph are less prominent. Contrast appears to have progressed to the rectum. Exam difficult to interpret due to body habitus. IMPRESSION: Small bowel obstruction pattern appears slightly improved Exam limited by body habitus. Electronically Signed   By: Genevive Bi M.D.   On: 05/05/2017 20:48   Dg Abd Portable 1v-small Bowel Protocol-position Verification  Result Date: 05/05/2017 CLINICAL DATA:  Status post NG tube placement EXAM: PORTABLE ABDOMEN - 1 VIEW COMPARISON:  05/04/2017 FINDINGS: Nasogastric tube tip is in the body of the stomach. The side port is well below GE junction. Multiple dilated loops of small bowel are again noted compatible with bowel obstruction. IMPRESSION: NG tube tip is in the body of the stomach. Electronically Signed   By: Signa Kell M.D.   On: 05/05/2017 01:51     Subjective: Tolerated his diet not having abdominal pain had a bowel movement this morning feels great wants to go home.  Discharge Exam: Vitals:   05/06/17 1325 05/07/17 0601  BP: (!) 159/66 (!) 145/67  Pulse: 92 98  Resp: 16 18  Temp: 98.6 F (37 C) 98.7 F (37.1 C)  SpO2: 98% 98%   Vitals:   05/05/17 2044 05/06/17 0424 05/06/17 1325 05/07/17 0601  BP: (!) 147/73 (!) 147/70 (!) 159/66 (!)  145/67  Pulse: 90 (!) 105 92 98  Resp: 16 14 16 18   Temp: 97.9 F (36.6 C) 99.1 F (37.3 C) 98.6 F (37 C) 98.7 F (37.1 C)  TempSrc: Oral Oral Oral Oral  SpO2: 94% 97% 98% 98%  Weight:      Height:        General: Pt is alert, awake, not in acute distress Cardiovascular: RRR, S1/S2 +, no rubs, no gallops Respiratory: CTA bilaterally, no wheezing, no rhonchi Abdominal: Soft, NT, ND, bowel sounds + Extremities: no edema, no cyanosis    The results of significant diagnostics from this hospitalization (including imaging, microbiology, ancillary and laboratory) are listed below for reference.     Microbiology: No results found for this or any previous visit (from the past 240 hour(s)).   Labs: BNP (last 3 results) No results for input(s): BNP in the last 8760 hours. Basic Metabolic Panel: Recent Labs  Lab 05/04/17 2055 05/05/17 0835  NA 129* 130*  K 5.5* 4.9  CL 95* 95*  CO2 20* 25  GLUCOSE 223* 145*  BUN 31* 29*  CREATININE 1.54* 1.16  CALCIUM 9.9 9.5   Liver Function Tests: Recent Labs  Lab 05/04/17 2055  AST 26  ALT 12*  ALKPHOS 63  BILITOT 0.5  PROT 9.1*  ALBUMIN 4.2   Recent Labs  Lab 05/04/17 2055  LIPASE 48   No results for input(s): AMMONIA in the last 168 hours. CBC: Recent Labs  Lab 05/04/17 2055 05/05/17 0835  WBC 11.9* 9.9  NEUTROABS  --  6.1  HGB 14.4 13.5  HCT 43.0 39.8  MCV 83.8 84.5  PLT 520* 429*   Cardiac Enzymes: No results for input(s): CKTOTAL, CKMB, CKMBINDEX, TROPONINI in the last 168 hours. BNP: Invalid input(s): POCBNP CBG: Recent Labs  Lab 05/06/17 0558 05/06/17 0738 05/06/17 1200 05/06/17 1638 05/06/17 2044  GLUCAP 132* 124* 143* 101* 116*   D-Dimer No results for input(s): DDIMER in the last 72 hours. Hgb A1c No results for input(s): HGBA1C in the last 72 hours. Lipid Profile No results for input(s): CHOL, HDL, LDLCALC, TRIG, CHOLHDL, LDLDIRECT in the last 72 hours. Thyroid function studies No  results for input(s): TSH, T4TOTAL, T3FREE, THYROIDAB in the last 72 hours.  Invalid input(s): FREET3 Anemia work up No results for input(s): VITAMINB12, FOLATE, FERRITIN, TIBC, IRON, RETICCTPCT in the last 72 hours. Urinalysis No results found for: COLORURINE, APPEARANCEUR, LABSPEC, PHURINE, GLUCOSEU, HGBUR, BILIRUBINUR, KETONESUR, PROTEINUR, UROBILINOGEN, NITRITE, LEUKOCYTESUR Sepsis Labs Invalid input(s): PROCALCITONIN,  WBC,  LACTICIDVEN Microbiology No results found for this or any previous visit (from the past 240 hour(s)).   Time coordinating discharge: Over 30 minutes  SIGNED:   Marinda Elk, MD  [facility] 05/07/2017, 8:55 AM Pager   If 7PM-7AM, please contact night-coverage [email or web address] Password [email or web address]

## 2017-05-07 NOTE — Progress Notes (Signed)
Discharge instructions reviewed with patient and patient verbalized understanding. Patient discharged via private vehicle. 

## 2017-05-07 NOTE — Progress Notes (Signed)
Patient has distended taut abdomin and high pitched bowel sounds.States he has had 3 BMs today. Tolerating diet. Ambulated in hall. Will report to next shift to address with MD on rounds, prior to discharge. Erick ColaceMelinda P Yussuf Sawyers, RN

## 2018-07-12 IMAGING — CT CT ABD-PELV W/ CM
3 of 6 series · 15 of 46 positions shown, 17 images · IV contrast (APPLIED)
Comparison: None

CLINICAL DATA: Nausea and vomiting.

EXAM:
CT ABDOMEN AND PELVIS WITH CONTRAST
TECHNIQUE: Multidetector CT imaging of the abdomen and pelvis was performed
using the standard protocol following bolus administration of
intravenous contrast.
CONTRAST:  100mL [id number] IOPAMIDOL ([id number]) INJECTION 61%

[Series 4: lung bases · axial · 0.84mm/px · z∈[+54,+109]mm · 2 of 34 slices shown]
[im 12/34  bone]
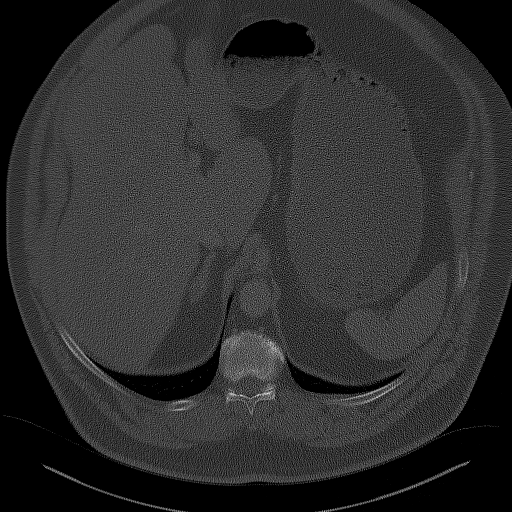
[im 23/34  bone]
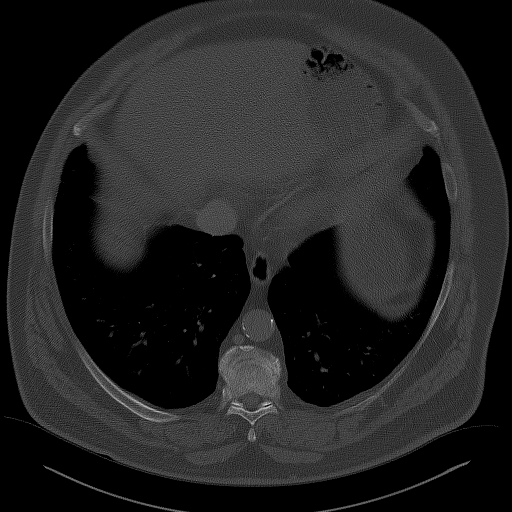

[Series 5: coronal st · coronal · 0.90mm/px · 3 of 144 slices shown]
[im 48/144  soft-tissue]
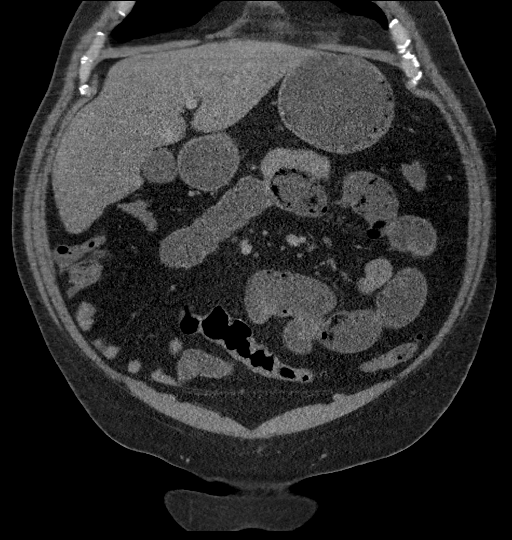
[im 64/144  soft-tissue]
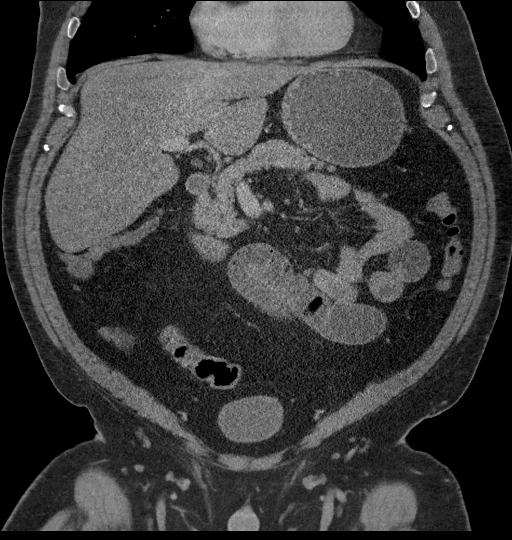
[im 80/144  soft-tissue]
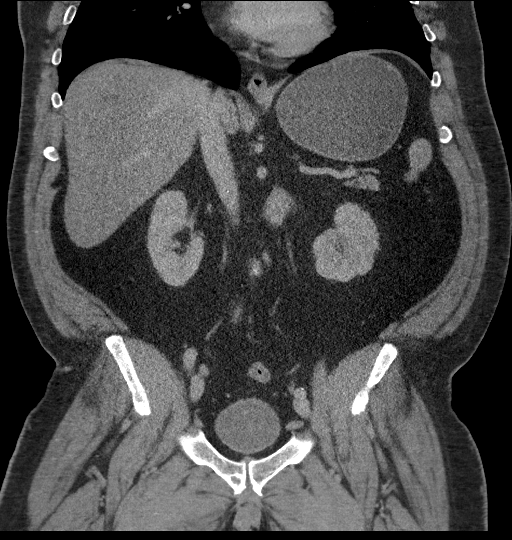

[Series 9: axial st · axial · 0.95mm/px · z∈[-271,+119]mm · 10 of 96 slices shown, 12 images]
[im 9/96  soft-tissue]
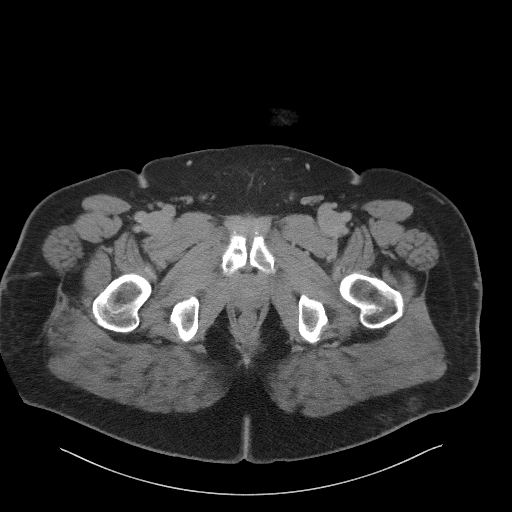
[im 9/96  bone]
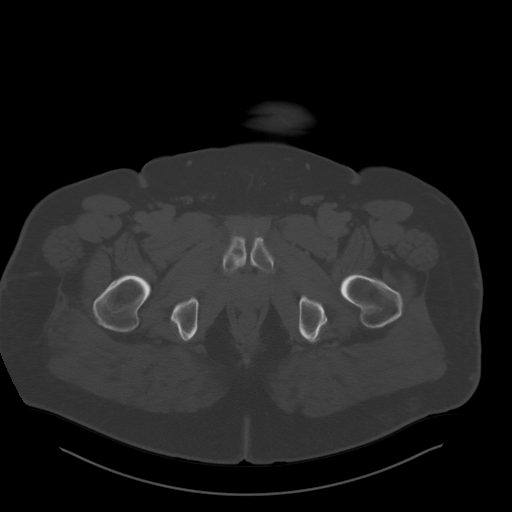
[im 18/96  soft-tissue]
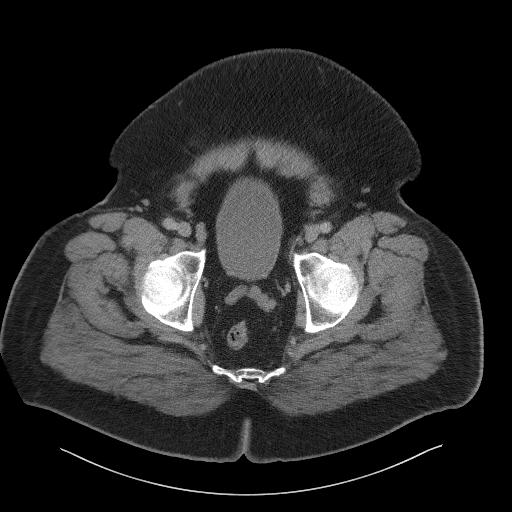
[im 26/96  soft-tissue]
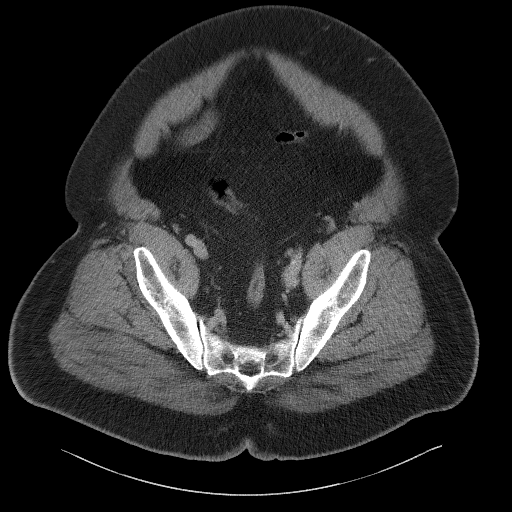
[im 35/96  soft-tissue]
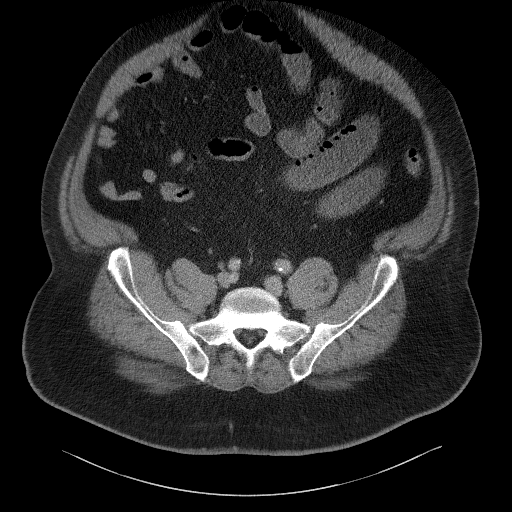
[im 44/96  soft-tissue]
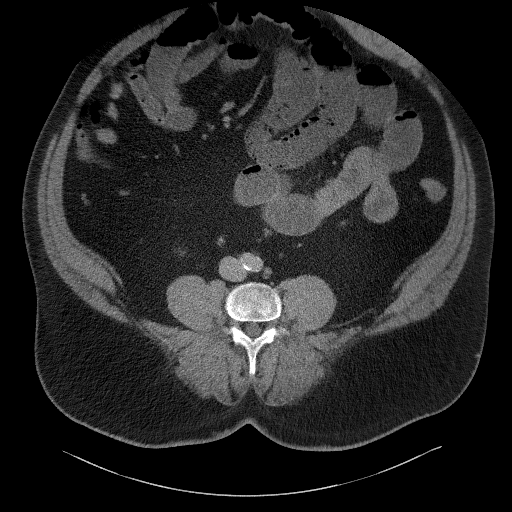
[im 52/96  soft-tissue]
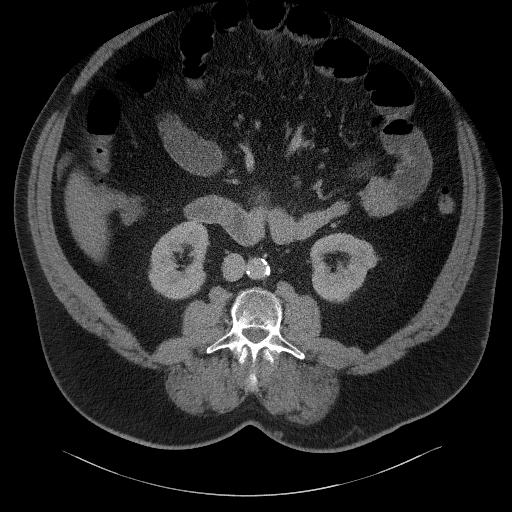
[im 61/96  soft-tissue]
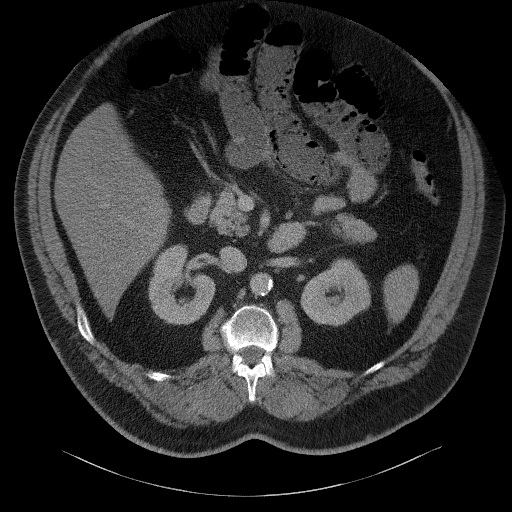
[im 70/96  soft-tissue]
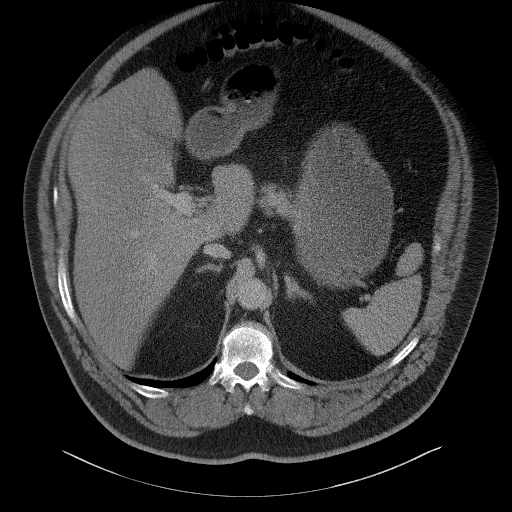
[im 78/96  soft-tissue]
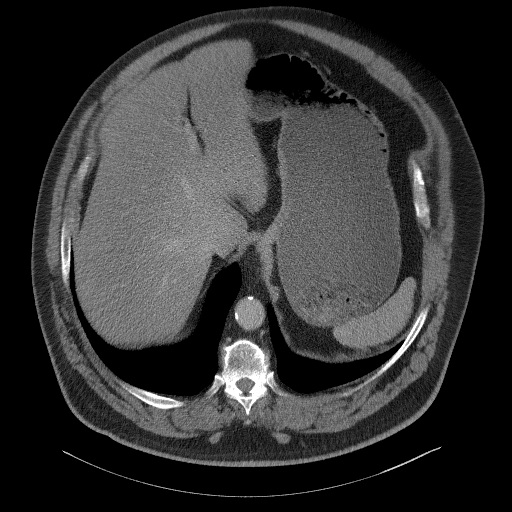
[im 78/96  bone]
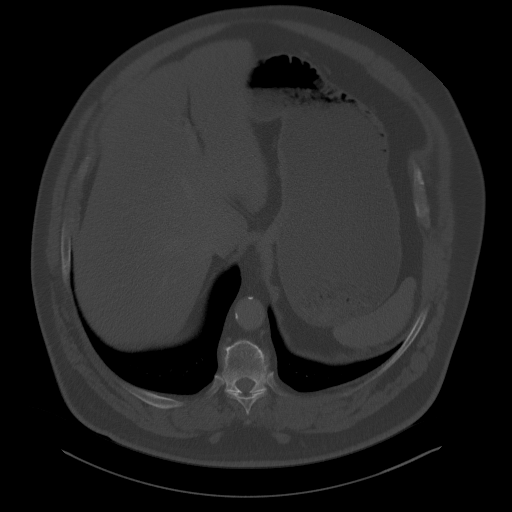
[im 87/96  soft-tissue]
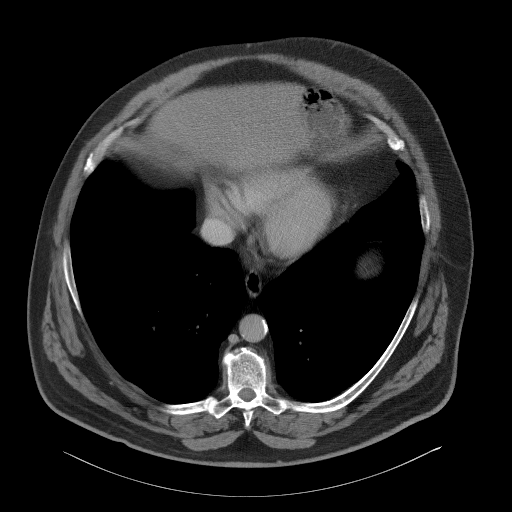

[15 of 46 positions shown; findings below may reference images not displayed]

FINDINGS: Note:

Lower chest: No acute abnormality. RCA coronary artery
calcifications noted. Aortic atherosclerosis identified.

Hepatobiliary: No focal liver abnormality is seen. No gallstones,
gallbladder wall thickening, or biliary dilatation.

Pancreas: Unremarkable. No pancreatic ductal dilatation or
surrounding inflammatory changes.

Spleen: Normal in size without focal abnormality.

Adrenals/Urinary Tract: The adrenal glands are normal. Unremarkable
appearance of both kidneys. The urinary bladder appears normal.

Stomach/Bowel: The stomach is distended. Proximal small bowel loops
are abnormally dilated with multiple fluid levels identified. A
transition to decreased caliber small bowel loops may be within the
central portion of the lower abdomen. The appendix is visualized and
appears normal. Normal appearance of the colon.

Vascular/Lymphatic: Aortic atherosclerosis. Infrarenal abdominal
aortic ectasia measures 2.7 cm. No adenopathy. No pelvic or inguinal
adenopathy.

Reproductive: Prostate is unremarkable.

Other: Because of patient's body habitus the ventral abdominal wall
is incompletely imaged. A ventral abdominal wall hernia cannot be
excluded because this area is not fully visualized.

Musculoskeletal: No acute or significant osseous findings.
IMPRESSION: 1. Examination is positive for small bowel obstruction. Transition
point not confidently identified but may be within the central
portion of the lower abdomen. Note, that the ventral abdominal wall
is incompletely visualized due to patient's body habitus. If there
is a ventral abdominal wall hernia (with or without bowel loops)
this may not be imaged for this reason.
2. Aortic Atherosclerosis ([id number]). RCA coronary artery
calcifications noted.
3. Ectatic abdominal aorta at risk for aneurysm development.
Recommend followup by ultrasound in 5 years. This recommendation
follows ACR consensus guidelines: White Paper of the ACR Incidental

## 2018-07-13 IMAGING — DX DG ABD PORTABLE 1V
1 series · 1 of 1 positions shown · non-contrast
Comparison: Plain film 05/05/2017, CT 05/04/2017

CLINICAL DATA: Small bowel obstruction

EXAM:
PORTABLE ABDOMEN - 1 VIEW

[abdomen kub]
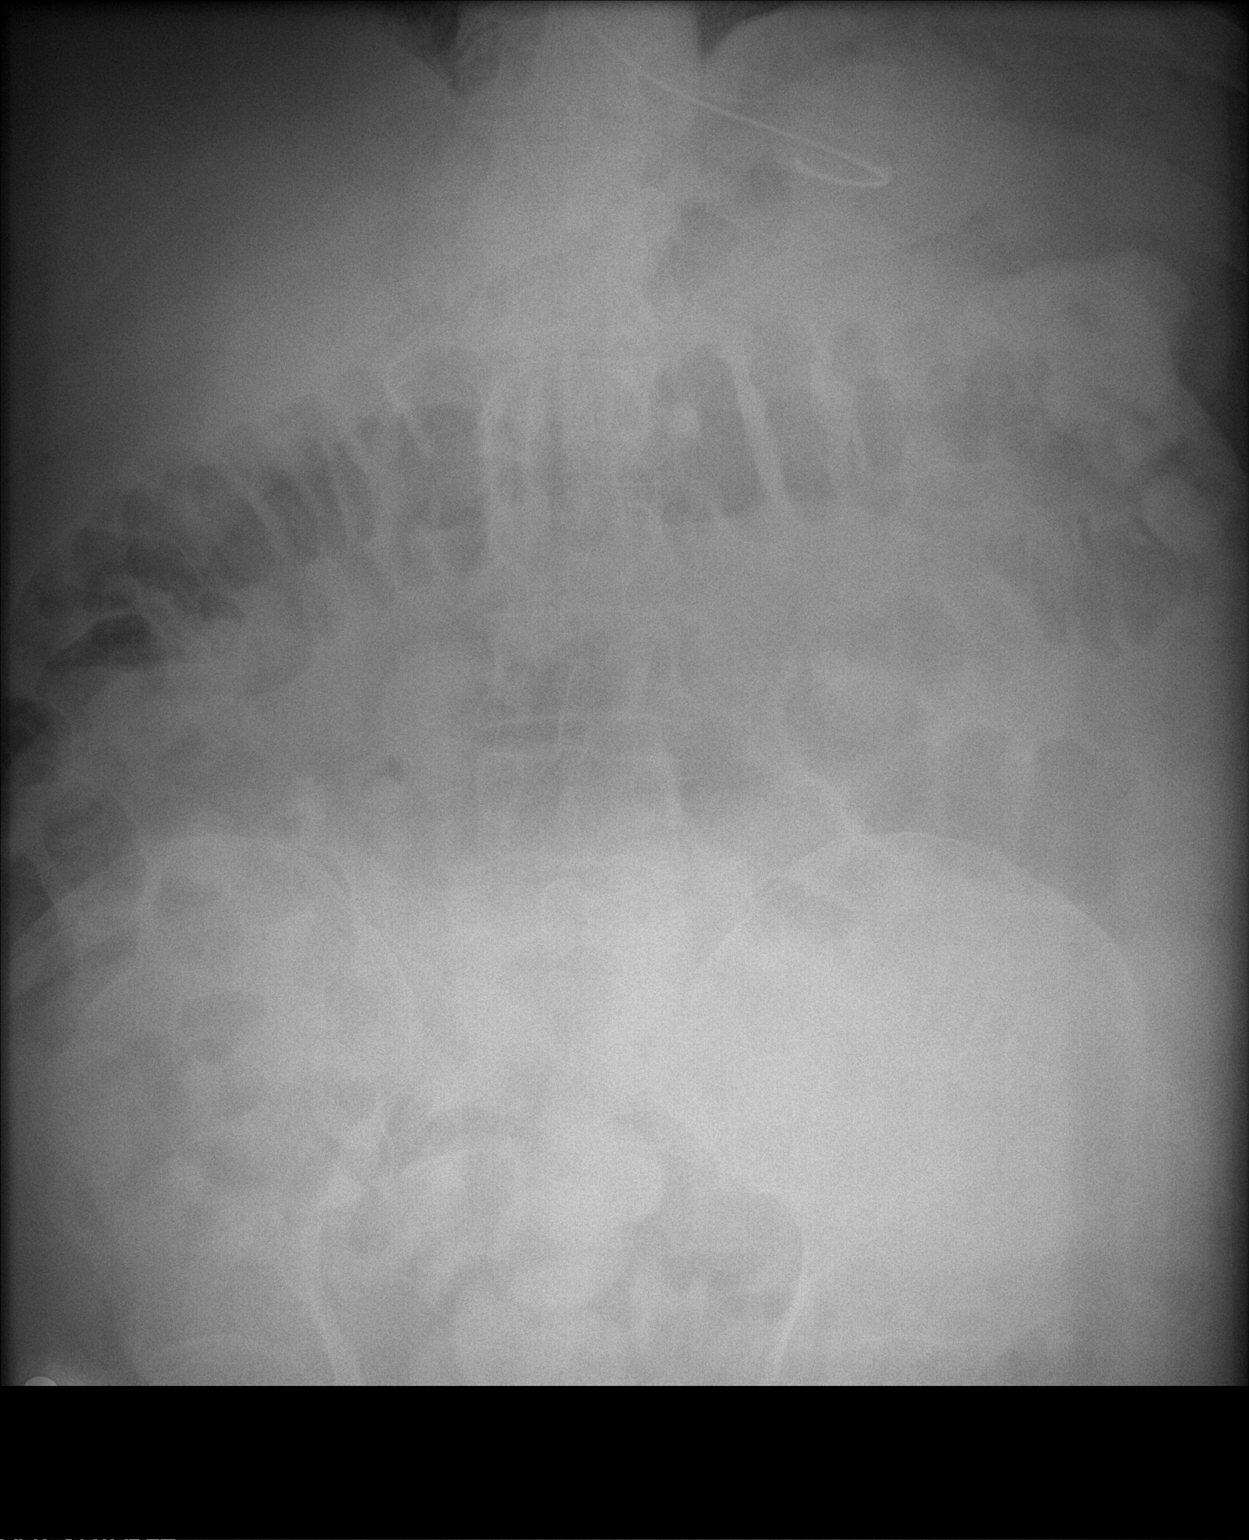

[1 of 1 positions shown; findings below may reference images not displayed]

FINDINGS: NG tube in stomach. Dilated loops of small bowel identified on
comparison radiograph are less prominent. Contrast appears to have
progressed to the rectum. Exam difficult to interpret due to body
habitus.
IMPRESSION: Small bowel obstruction pattern appears slightly improved

Exam limited by body habitus.

## 2022-06-12 ENCOUNTER — Telehealth (HOSPITAL_BASED_OUTPATIENT_CLINIC_OR_DEPARTMENT_OTHER): Payer: Self-pay | Admitting: *Deleted

## 2022-06-12 NOTE — Telephone Encounter (Signed)
Left  message for patient to call and discuss scheduling the New Patient appointment requested by the [facility] in [facility]

## 2022-06-20 ENCOUNTER — Ambulatory Visit (INDEPENDENT_AMBULATORY_CARE_PROVIDER_SITE_OTHER): Payer: No Typology Code available for payment source | Admitting: Cardiology

## 2022-06-20 ENCOUNTER — Encounter (HOSPITAL_BASED_OUTPATIENT_CLINIC_OR_DEPARTMENT_OTHER): Payer: Self-pay | Admitting: Cardiology

## 2022-06-20 VITALS — BP 134/76 | HR 82 | Ht 66.0 in | Wt 338.0 lb

## 2022-06-20 DIAGNOSIS — E1169 Type 2 diabetes mellitus with other specified complication: Secondary | ICD-10-CM

## 2022-06-20 DIAGNOSIS — I2510 Atherosclerotic heart disease of native coronary artery without angina pectoris: Secondary | ICD-10-CM

## 2022-06-20 DIAGNOSIS — E669 Obesity, unspecified: Secondary | ICD-10-CM

## 2022-06-20 DIAGNOSIS — I10 Essential (primary) hypertension: Secondary | ICD-10-CM

## 2022-06-20 DIAGNOSIS — E785 Hyperlipidemia, unspecified: Secondary | ICD-10-CM | POA: Diagnosis not present

## 2022-06-20 DIAGNOSIS — G4733 Obstructive sleep apnea (adult) (pediatric): Secondary | ICD-10-CM

## 2022-06-20 DIAGNOSIS — R0609 Other forms of dyspnea: Secondary | ICD-10-CM

## 2022-06-20 DIAGNOSIS — Z8249 Family history of ischemic heart disease and other diseases of the circulatory system: Secondary | ICD-10-CM

## 2022-06-20 MED ORDER — ASPIRIN 81 MG PO TBEC: 81.0000 mg | DELAYED_RELEASE_TABLET | Freq: Every day | ORAL | 3 refills | Status: AC

## 2022-06-20 NOTE — Patient Instructions (Signed)
Medication Instructions:  Aspirin 81 mcg daily  *If you need a refill on your cardiac medications before your next appointment, please call your pharmacy*   Lab Work: BMP, CBC, Lipid panel, Lipoprotein A (LPA)  If you have labs (blood work) drawn today and your tests are completely normal, you will receive your results only by: [facility] (if you have MyChart) OR A paper copy in the mail If you have any lab test that is abnormal or we need to change your treatment, we will call you to review the results.   Testing/Procedures: Your physician has requested that you have a cardiac catheterization. Cardiac catheterization is used to diagnose and/or treat various heart conditions. Doctors may recommend this procedure for a number of different reasons. The most common reason is to evaluate chest pain. Chest pain can be a symptom of coronary artery disease (CAD), and cardiac catheterization can show whether plaque is narrowing or blocking your heart's arteries. This procedure is also used to evaluate the valves, as well as measure the blood flow and oxygen levels in different parts of your heart. For further information please visit [email or web address]. Please follow instruction sheet, as given.    Follow-Up: At [facility], you and your health needs are our priority.  As part of our continuing mission to provide you with exceptional heart care, we have created designated Provider Care Teams.  These Care Teams include your primary Cardiologist (physician) and Advanced Practice Providers (APPs -  Physician Assistants and Nurse Practitioners) who all work together to provide you with the care you need, when you need it.  We recommend signing up for the patient portal called "MyChart".  Sign up information is provided on this After Visit Summary.  MyChart is used to connect with patients for Virtual Visits (Telemedicine).  Patients are able to view lab/test results, encounter notes,  upcoming appointments, etc.  Non-urgent messages can be sent to your provider as well.   To learn more about what you can do with MyChart, go to [email or web address].    Your next appointment:   2 week(s) After Cath is completed.   Provider:   Buford Dresser, MD    Other Instructions  [place] [id number] Dept: [place]  06/20/2022  You are scheduled for a Cardiac Catheterization on Thursday, March 7 with Dr. Lauree Chandler.  1. Please arrive at the [facility] (Main Entrance A) at [facility]: [address] [place],  [address] at 8:30 AM (This time is two hours before your procedure to ensure your preparation). Free valet parking service is available.   Special note: Every effort is made to have your procedure done on time. Please understand that emergencies sometimes delay scheduled procedures.  2. Diet: Do not eat solid foods after midnight.  The patient may have clear liquids until 5am upon the day of the procedure.  3. Labs: You will need to have blood drawn today  4. Medication instructions in preparation for your procedure:   Contrast Allergy: No  Hold Furosemide morning of Cath.   Do not take Diabetes Med Glucophage (Metformin) on the day of the procedure and HOLD 48 HOURS AFTER THE PROCEDURE.  On the morning of your procedure, take your Aspirin 81 mg and any morning medicines NOT listed above.  You may use sips of water.  5. Plan for one night stay--bring personal belongings.  6. Bring a current list of your medications and current insurance cards. 7. You MUST have a responsible person to drive you home. 8. Someone MUST be with you the first 24 hours after you arrive home or your discharge will be delayed. 9. Please wear clothes that are easy to get on and off and wear slip-on shoes.  Thank you  for allowing [place] to care for you!   -- [facility]

## 2022-06-20 NOTE — H&P (View-Only) (Signed)
Cardiology Office Note:    Date:  06/20/2022   ID:  Steven Rubio, DOB January 24, 1961, MRN [id number]  PCP:  [place]  Cardiologist:  Buford Dresser, MD  Referring MD: [facility]   CC: new patient consult for evaluation of CAD  History of Present Illness:    Steven Rubio is a 62 y.o. male with a hx of nonobstructive CAD, obesity, hypertension, type II diabetes, hyperlipidemia, OSA on bipap, COPD, former smoker, family history of CAD, who is seen as a new consult at the request of [place] for the evaluation and management of DOE.  Records from [place] reviewed: Risk factors of morbid obesity, hypertension, type II diabetes, hyperlipidemia, OSA on no mask due to claustrophobia, former smoker, family history (father with CAD at age 66, sisters with Mis). Chronic DOE with multiple unremarkable stress tests. Asking for LHC for definitive CAD assessment. Note from 05/02/22 from [place] note normal cath 2013, normal lexiscan 2016. Had chest pain in 2021, echo showed pericardial effusion, treated with colchicine.   Echo 05/28/20: LV normal size, mild LVH, normal LV function. Mild aortic sclerosis, small to moderate pericardial effusion.   Lexiscan 02/19/20: EF 65-70%, fixed inferior wall defect without reversibility, SDS of 2.  Attempted cath 03/08/20 for chest pain, borderline stress test. He became agitated due to his PTSD when C-arm was near his head. Noted that he could not tolerate this, recommended to have general anesthesia for cath.  LHC 04/25/2011: nonobstructive. No stenosis in LM. Max stenosis 20% in LAD, 20% in Circ, 20% in RCA.  Today: Reviewed that he was sent to discuss DOE and potential cath. He states that his DOE has been stable and he feels this is due to his COPD. Has a strong family history.   When he was in his 60s, told he had a "spasm" in his heart, not a heart attack.  Notes that he has chest tightness when he is breathing hard. Feels heart  "popping" sometimes when he takes a deep breath. Wonders if this is due to the fluid that has been seen around his heart.   Reports occasional chest tightness/achyness. This can be at rest. Can last anywhere from a few minutes to 90 minutes. Hasn't needed nitro. Feels this has been stable, mild. Not as severe as it has been in the past. Heaviness is left central chest, comes with mild nausea, lightheadedness.   ROS positive for bilateral hands and arms that tingle, better when he shakes it out.   He uses scooter to ambulate. He has PTSD/anxiety/claustrophobia, has been a long term issue for him. Cannot tolerate any masks to treat his OSA.  FH: sister had massive MI at age 65, survived. Sister passed from [place] complications recently. Father had CABG x4 at age 22, then repeat CABG x3.  Mother had diabetes, stomach issues.  Past Medical History:  Diagnosis Date   Arthritis    Asthma    Back pain    CHF (congestive heart failure) (HCC)    COPD (chronic obstructive pulmonary disease) ([place])    Coronary artery disease    Diabetes mellitus without complication ([place])    Hypertension     Past Surgical History:  Procedure Laterality Date   VENTRAL HERNIA REPAIR  [facility] [place]    Current Medications: Current Outpatient Medications on File Prior to Visit  Medication Sig   acetaminophen (TYLENOL) 500 MG tablet Take 500 mg by mouth every 8 (eight) hours  as needed for mild pain or moderate pain.    allopurinol (ZYLOPRIM) 300 MG tablet Take 300 mg by mouth daily.   amLODipine (NORVASC) 10 MG tablet Take 5 mg by mouth at bedtime.   ARTIFICIAL TEAR OP Place 1 drop into both eyes every 6 (six) hours as needed (dry eyes).   atenolol (TENORMIN) 50 MG tablet Take 50 mg by mouth daily.   celecoxib (CELEBREX) 200 MG capsule Take 200 mg by mouth daily.   cetirizine (ZYRTEC) 10 MG tablet Take 10 mg by mouth daily as needed for allergies.   cholecalciferol (VITAMIN D) 1000 units tablet Take 1,000  Units by mouth at bedtime.   famotidine (PEPCID) 40 MG tablet Take 40 mg by mouth 2 (two) times daily.   fluticasone (FLONASE) 50 MCG/ACT nasal spray Place 1 spray into both nostrils daily as needed for allergies or rhinitis.   furosemide (LASIX) 40 MG tablet Take 40 mg by mouth daily.   gabapentin (NEURONTIN) 300 MG capsule Take 300 mg by mouth 3 (three) times daily.   ipratropium (ATROVENT) 0.06 % nasal spray Place 2 sprays into both nostrils at bedtime.   loperamide (IMODIUM A-D) 2 MG tablet Take 2 mg by mouth 4 (four) times daily as needed for diarrhea or loose stools.   losartan (COZAAR) 100 MG tablet Take 100 mg by mouth daily.   Magnesium Oxide 420 MG TABS Take 1 tablet by mouth daily.   metFORMIN (GLUCOPHAGE) 1000 MG tablet Take 1,000 mg by mouth 2 (two) times daily with a meal.   omeprazole (PRILOSEC) 40 MG capsule Take 40 mg by mouth 2 (two) times daily.   oxybutynin (DITROPAN-XL) 5 MG 24 hr tablet Take 5 mg by mouth at bedtime.   ranitidine (ZANTAC) 300 MG tablet Take 300 mg by mouth at bedtime.   rosuvastatin (CRESTOR) 20 MG tablet Take 20 mg by mouth daily.   Semaglutide, 1 MG/DOSE, 4 MG/3ML SOPN Inject 1 mg into the skin once a week.   sertraline (ZOLOFT) 100 MG tablet Take 100 mg by mouth every morning.   sildenafil (VIAGRA) 100 MG tablet Take 100 mg by mouth daily as needed for erectile dysfunction.   tamsulosin (FLOMAX) 0.4 MG CAPS capsule Take 0.4 mg by mouth 2 (two) times daily.   vitamin B-12 (CYANOCOBALAMIN) 500 MCG tablet Take 500 mcg by mouth daily.   No current facility-administered medications on file prior to visit.     Allergies:   Penicillins   Social History   Tobacco Use   Smoking status: Former   Smokeless tobacco: Never  Substance Use Topics   Alcohol use: No   Drug use: No    Family History: sister had massive MI at age 75, survived. Sister passed from [place] complications recently. Father had CABG x4 at age 39, then repeat CABG x3.  Mother had  diabetes, stomach issues.  ROS:   Please see the history of present illness.  Additional pertinent ROS: Constitutional: Negative for chills, fever, night sweats, unintentional weight loss  HENT: Negative for ear pain and hearing loss.   Eyes: Negative for loss of vision and eye pain.  Respiratory: Negative for cough, sputum, wheezing.   Cardiovascular: See HPI. Gastrointestinal: Negative for abdominal pain, melena, and hematochezia.  Genitourinary: Negative for dysuria and hematuria.  Musculoskeletal: Negative for falls and myalgias.  Skin: Negative for itching and rash.  Neurological: Negative for focal weakness, focal sensory changes and loss of consciousness.  Endo/Heme/Allergies: Does not bruise/bleed easily.  EKGs/Labs/Other Studies Reviewed:    The following studies were reviewed today: See HPI for summary  EKG:  EKG is personally reviewed.   06/20/22: NSR at 82 bpm  Recent Labs: No results found for requested labs within last 365 days.  Recent Lipid Panel No results found for: "CHOL", "TRIG", "HDL", "CHOLHDL", "VLDL", "LDLCALC", "LDLDIRECT"  Physical Exam:    VS:  BP 134/76   Pulse 82   Ht '5\' 6"'$  (1.676 m)   Wt (!) 338 lb (153.3 kg)   BMI 54.55 kg/m     Wt Readings from Last 3 Encounters:  06/20/22 (!) 338 lb (153.3 kg)  05/05/17 225 lb 15.5 oz (102.5 kg)    GEN: Well nourished, well developed in no acute distress HEENT: Normal, moist mucous membranes NECK: No JVD CARDIAC: regular rhythm, normal S1 and S2, no rubs or gallops. No murmur. VASCULAR: Radial and DP pulses 2+ bilaterally. No carotid bruits RESPIRATORY:  Clear to auscultation without rales, wheezing or rhonchi  ABDOMEN: Soft, non-tender, non-distended MUSCULOSKELETAL:  Ambulates independently SKIN: Warm and dry, bilateral nonpitting edema LE with compression stockings NEUROLOGIC:  Alert and oriented x 3. No focal neuro deficits noted. PSYCHIATRIC:  Normal affect    ASSESSMENT:    1. Dyspnea on  exertion   2. Nonocclusive coronary atherosclerosis of native coronary artery   3. Essential hypertension   4. Hyperlipidemia, unspecified hyperlipidemia type   5. OSA (obstructive sleep apnea)   6. Diabetes mellitus type 2 in obese ([place])   7. Family history of heart disease    PLAN:    Dyspnea on exertion Family history of CAD Nonobstructive CAD -weight 338 lb, BMI 54. Ideally would like to use CT coronary, but given body habitus, suspect this will not be a high quality study -has had normal lexiscan studies -attempted for repeat cath at [facility], could not tolerate enclosed space, could not be completed. Team recommended general anesthesia for cath -we discussed cath at length today. He states that he tolerated in the past, was having a particularly hard day when he was at [facility]. Discussed that we do not use general anesthesia. He feels he should be able to tolerate cath with typical conscious sedation -reviewed red flag warning signs that need immediate attention. -not currently on an aspirin, told to stop by one of his doctors at the [place]. Discussed that he should be on an 81 mg aspirin, starting today. No issues with bleeding. -on statin -discussed not taking additional dose of semaglutide prior to cath. Will have at least 5 days without it prior to cath  Shared Decision Making/Informed Consent The risks [stroke (1 in 1000), death (1 in 1000), kidney failure [usually temporary] (1 in 500), bleeding (1 in 200), allergic reaction [possibly serious] (1 in 200)], benefits (diagnostic support and management of coronary artery disease) and alternatives of a cardiac catheterization were discussed in detail with Mr. Desautel and he is willing to proceed.   Type II diabetes Obesity Hyperlipidemia -on semaglutide, metformin. Holding semaglutide for cath -aspirin, statin as above  Hypertension -on losartan, furosemide, amlodipine, atenolol  Cardiac risk counseling and prevention  recommendations: -recommend heart healthy/Mediterranean diet, with whole grains, fruits, vegetable, fish, lean meats, nuts, and olive oil. Limit salt. -recommend moderate walking, 3-5 times/week for 30-50 minutes each session. Aim for at least 150 minutes.week. Goal should be pace of 3 miles/hours, or walking 1.5 miles in 30 minutes -recommend avoidance of tobacco products. Avoid excess alcohol. -ASCVD risk score: The ASCVD Risk score (Arnett  DK, et al., 2019) failed to calculate for the following reasons:   The valid total cholesterol range is 130 to 320 mg/dL    Plan for follow up:  Buford Dresser, MD, PhD, [facility] at [facility] [address] [place], [place] [address] [phone]   Medication Adjustments/Labs and Tests Ordered: Current medicines are reviewed at length with the patient today.  Concerns regarding medicines are outlined above.  Orders Placed This Encounter  Procedures   Basic Metabolic Panel (BMET)   CBC   Lipoprotein A (LPA)   Lipid panel   EKG 12-Lead   Meds ordered this encounter  Medications   aspirin EC 81 MG tablet    Sig: Take 1 tablet (81 mg total) by mouth daily. Swallow whole.    Dispense:  90 tablet    Refill:  3    Patient Instructions  Medication Instructions:  Aspirin 81 mcg daily  *If you need a refill on your cardiac medications before your next appointment, please call your pharmacy*   Lab Work: BMP, CBC, Lipid panel, Lipoprotein A (LPA)  If you have labs (blood work) drawn today and your tests are completely normal, you will receive your results only by: [place] (if you have MyChart) OR A paper copy in the mail If you have any lab test that is abnormal or we need to change your treatment, we will call you to review the results.   Testing/Procedures: Your physician has requested that you have a cardiac  catheterization. Cardiac catheterization is used to diagnose and/or treat various heart conditions. Doctors may recommend this procedure for a number of different reasons. The most common reason is to evaluate chest pain. Chest pain can be a symptom of coronary artery disease (CAD), and cardiac catheterization can show whether plaque is narrowing or blocking your heart's arteries. This procedure is also used to evaluate the valves, as well as measure the blood flow and oxygen levels in different parts of your heart. For further information please visit [email or web address]. Please follow instruction sheet, as given.    Follow-Up: At [facility], you and your health needs are our priority.  As part of our continuing mission to provide you with exceptional heart care, we have created designated Provider Care Teams.  These Care Teams include your primary Cardiologist (physician) and Advanced Practice Providers (APPs -  Physician Assistants and Nurse Practitioners) who all work together to provide you with the care you need, when you need it.  We recommend signing up for the patient portal called "MyChart".  Sign up information is provided on this After Visit Summary.  MyChart is used to connect with patients for Virtual Visits (Telemedicine).  Patients are able to view lab/test results, encounter notes, upcoming appointments, etc.  Non-urgent messages can be sent to your provider as well.   To learn more about what you can do with MyChart, go to [email or web address].    Your next appointment:   2 week(s) After Cath is completed.   Provider:   Buford Dresser, MD    Other Instructions  [place] [id number] Dept: [place]  06/20/2022  You are scheduled for a Cardiac Catheterization on Thursday, March 7 with Dr. Lauree Chandler.  1.  Please arrive at the [facility] (Main Entrance A) at  [facility]: [address] [place], [place] [address] at 8:30 AM (This time is two hours before your procedure to ensure your preparation). Free valet parking service is available.   Special note: Every effort is made to have your procedure done on time. Please understand that emergencies sometimes delay scheduled procedures.  2. Diet: Do not eat solid foods after midnight.  The patient may have clear liquids until 5am upon the day of the procedure.  3. Labs: You will need to have blood drawn today  4. Medication instructions in preparation for your procedure:   Contrast Allergy: No  Hold Furosemide morning of Cath.   Do not take Diabetes Med Glucophage (Metformin) on the day of the procedure and HOLD 48 HOURS AFTER THE PROCEDURE.  On the morning of your procedure, take your Aspirin 81 mg and any morning medicines NOT listed above.  You may use sips of water.  5. Plan for one night stay--bring personal belongings. 6. Bring a current list of your medications and current insurance cards. 7. You MUST have a responsible person to drive you home. 8. Someone MUST be with you the first 24 hours after you arrive home or your discharge will be delayed. 9. Please wear clothes that are easy to get on and off and wear slip-on shoes.  Thank you for allowing [place] to care for you!   -- [facility]   Signed, Buford Dresser, MD PhD 06/20/2022 12:41 PM    [place]

## 2022-06-20 NOTE — Progress Notes (Signed)
Cardiology Office Note:    Date:  06/20/2022   ID:  Nedra Hai, DOB 04/21/1960, MRN [id number]  PCP:  [place]  Cardiologist:  Buford Dresser, MD  Referring MD: [facility]   CC: new patient consult for evaluation of CAD  History of Present Illness:    Steven Rubio is a 62 y.o. male with a hx of nonobstructive CAD, obesity, hypertension, type II diabetes, hyperlipidemia, OSA on bipap, COPD, former smoker, family history of CAD, who is seen as a new consult at the request of [facility] for the evaluation and management of DOE.  Records from [place] reviewed: Risk factors of morbid obesity, hypertension, type II diabetes, hyperlipidemia, OSA on no mask due to claustrophobia, former smoker, family history (father with CAD at age 52, sisters with Mis). Chronic DOE with multiple unremarkable stress tests. Asking for LHC for definitive CAD assessment. Note from 05/02/22 from [place] note normal cath 2013, normal lexiscan 2016. Had chest pain in 2021, echo showed pericardial effusion, treated with colchicine.   Echo 05/28/20: LV normal size, mild LVH, normal LV function. Mild aortic sclerosis, small to moderate pericardial effusion.   Lexiscan 02/19/20: EF 65-70%, fixed inferior wall defect without reversibility, SDS of 2.  Attempted cath 03/08/20 for chest pain, borderline stress test. He became agitated due to his PTSD when C-arm was near his head. Noted that he could not tolerate this, recommended to have general anesthesia for cath.  LHC 04/25/2011: nonobstructive. No stenosis in LM. Max stenosis 20% in LAD, 20% in Circ, 20% in RCA.  Today: Reviewed that he was sent to discuss DOE and potential cath. He states that his DOE has been stable and he feels this is due to his COPD. Has a strong family history.   When he was in his 23s, told he had a "spasm" in his heart, not a heart attack.  Notes that he has chest tightness when he is breathing hard. Feels heart  "popping" sometimes when he takes a deep breath. Wonders if this is due to the fluid that has been seen around his heart.   Reports occasional chest tightness/achyness. This can be at rest. Can last anywhere from a few minutes to 90 minutes. Hasn't needed nitro. Feels this has been stable, mild. Not as severe as it has been in the past. Heaviness is left central chest, comes with mild nausea, lightheadedness.   ROS positive for bilateral hands and arms that tingle, better when he shakes it out.   He uses scooter to ambulate. He has PTSD/anxiety/claustrophobia, has been a long term issue for him. Cannot tolerate any masks to treat his OSA.  FH: sister had massive MI at age 25, survived. Sister passed from [place] complications recently. Father had CABG x4 at age 31, then repeat CABG x3.  Mother had diabetes, stomach issues.  Past Medical History:  Diagnosis Date   Arthritis    Asthma    Back pain    CHF (congestive heart failure) (HCC)    COPD (chronic obstructive pulmonary disease) ([place])    Coronary artery disease    Diabetes mellitus without complication ([place])    Hypertension     Past Surgical History:  Procedure Laterality Date   [facility]TRAL [facility]ERNIA [facility]  [facility] [place]    Current Medications: Current Outpatient Medications on File Prior to Visit  Medication Sig   acetaminophen (TYLENOL) 500 MG tablet Take 500 mg by mouth every 8 (eight) hours  as needed for mild pain or moderate pain.    allopurinol (ZYLOPRIM) 300 MG tablet Take 300 mg by mouth daily.   amLODipine (NORVASC) 10 MG tablet Take 5 mg by mouth at bedtime.   ARTIFICIAL TEAR OP Place 1 drop into both eyes every 6 (six) hours as needed (dry eyes).   atenolol (TENORMIN) 50 MG tablet Take 50 mg by mouth daily.   celecoxib (CELEBREX) 200 MG capsule Take 200 mg by mouth daily.   cetirizine (ZYRTEC) 10 MG tablet Take 10 mg by mouth daily as needed for allergies.   cholecalciferol (VITAMIN D) 1000 units tablet Take 1,000  Units by mouth at bedtime.   famotidine (PEPCID) 40 MG tablet Take 40 mg by mouth 2 (two) times daily.   fluticasone (FLONASE) 50 MCG/ACT nasal spray Place 1 spray into both nostrils daily as needed for allergies or rhinitis.   furosemide (LASIX) 40 MG tablet Take 40 mg by mouth daily.   gabapentin (NEURONTIN) 300 MG capsule Take 300 mg by mouth 3 (three) times daily.   ipratropium (ATROVENT) 0.06 % nasal spray Place 2 sprays into both nostrils at bedtime.   loperamide (IMODIUM A-D) 2 MG tablet Take 2 mg by mouth 4 (four) times daily as needed for diarrhea or loose stools.   losartan (COZAAR) 100 MG tablet Take 100 mg by mouth daily.   Magnesium Oxide 420 MG TABS Take 1 tablet by mouth daily.   metFORMIN (GLUCOPHAGE) 1000 MG tablet Take 1,000 mg by mouth 2 (two) times daily with a meal.   omeprazole (PRILOSEC) 40 MG capsule Take 40 mg by mouth 2 (two) times daily.   oxybutynin (DITROPAN-XL) 5 MG 24 hr tablet Take 5 mg by mouth at bedtime.   ranitidine (ZANTAC) 300 MG tablet Take 300 mg by mouth at bedtime.   rosuvastatin (CRESTOR) 20 MG tablet Take 20 mg by mouth daily.   Semaglutide, 1 MG/DOSE, 4 MG/3ML SOPN Inject 1 mg into the skin once a week.   sertraline (ZOLOFT) 100 MG tablet Take 100 mg by mouth every morning.   sildenafil (VIAGRA) 100 MG tablet Take 100 mg by mouth daily as needed for erectile dysfunction.   tamsulosin (FLOMAX) 0.4 MG CAPS capsule Take 0.4 mg by mouth 2 (two) times daily.   vitamin B-12 (CYANOCOBALAMIN) 500 MCG tablet Take 500 mcg by mouth daily.   No current facility-administered medications on file prior to visit.     Allergies:   Penicillins   Social History   Tobacco Use   Smoking status: Former   Smokeless tobacco: Never  Substance Use Topics   Alcohol use: No   Drug use: No    Family History: sister had massive MI at age 110, survived. Sister passed from [place] complications recently. Father had CABG x4 at age 54, then repeat CABG x3.  Mother had  diabetes, stomach issues.  ROS:   Please see the history of present illness.  Additional pertinent ROS: Constitutional: Negative for chills, fever, night sweats, unintentional weight loss  HENT: Negative for ear pain and hearing loss.   Eyes: Negative for loss of vision and eye pain.  Respiratory: Negative for cough, sputum, wheezing.   Cardiovascular: See HPI. Gastrointestinal: Negative for abdominal pain, melena, and hematochezia.  Genitourinary: Negative for dysuria and hematuria.  Musculoskeletal: Negative for falls and myalgias.  Skin: Negative for itching and rash.  Neurological: Negative for focal weakness, focal sensory changes and loss of consciousness.  Endo/Heme/Allergies: Does not bruise/bleed easily.  EKGs/Labs/Other Studies Reviewed:    The following studies were reviewed today: See HPI for summary  EKG:  EKG is personally reviewed.   06/20/22: NSR at 82 bpm  Recent Labs: No results found for requested labs within last 365 days.  Recent Lipid Panel No results found for: "CHOL", "TRIG", "HDL", "CHOLHDL", "VLDL", "LDLCALC", "LDLDIRECT"  Physical Exam:    VS:  BP 134/76   Pulse 82   Ht '5\' 6"'$  (1.676 m)   Wt (!) 338 lb (153.3 kg)   BMI 54.55 kg/m     Wt Readings from Last 3 Encounters:  06/20/22 (!) 338 lb (153.3 kg)  05/05/17 225 lb 15.5 oz (102.5 kg)    GEN: Well nourished, well developed in no acute distress HEENT: Normal, moist mucous membranes NECK: No JVD CARDIAC: regular rhythm, normal S1 and S2, no rubs or gallops. No murmur. VASCULAR: Radial and DP pulses 2+ bilaterally. No carotid bruits RESPIRATORY:  Clear to auscultation without rales, wheezing or rhonchi  ABDOMEN: Soft, non-tender, non-distended MUSCULOSKELETAL:  Ambulates independently SKIN: Warm and dry, bilateral nonpitting edema LE with compression stockings NEUROLOGIC:  Alert and oriented x 3. No focal neuro deficits noted. PSYCHIATRIC:  Normal affect    ASSESSMENT:    1. Dyspnea on  exertion   2. Nonocclusive coronary atherosclerosis of native coronary artery   3. Essential hypertension   4. Hyperlipidemia, unspecified hyperlipidemia type   5. OSA (obstructive sleep apnea)   6. Diabetes mellitus type 2 in obese ([place])   7. Family history of heart disease    PLAN:    Dyspnea on exertion Family history of CAD Nonobstructive CAD -weight 338 lb, BMI 54. Ideally would like to use CT coronary, but given body habitus, suspect this will not be a high quality study -has had normal lexiscan studies -attempted for repeat cath at [facility], could not tolerate enclosed space, could not be completed. Team recommended general anesthesia for cath -we discussed cath at length today. He states that he tolerated in the past, was having a particularly hard day when he was at [facility]. Discussed that we do not use general anesthesia. He feels he should be able to tolerate cath with typical conscious sedation -reviewed red flag warning signs that need immediate attention. -not currently on an aspirin, told to stop by one of his doctors at the [place]. Discussed that he should be on an 81 mg aspirin, starting today. No issues with bleeding. -on statin -discussed not taking additional dose of semaglutide prior to cath. Will have at least 5 days without it prior to cath  Shared Decision Making/Informed Consent The risks [stroke (1 in 1000), death (1 in 1000), kidney failure [usually temporary] (1 in 500), bleeding (1 in 200), allergic reaction [possibly serious] (1 in 200)], benefits (diagnostic support and management of coronary artery disease) and alternatives of a cardiac catheterization were discussed in detail with Mr. Darbyshire and he is willing to proceed.   Type II diabetes Obesity Hyperlipidemia -on semaglutide, metformin. Holding semaglutide for cath -aspirin, statin as above  Hypertension -on losartan, furosemide, amlodipine, atenolol  Cardiac risk counseling and prevention  recommendations: -recommend heart healthy/Mediterranean diet, with whole grains, fruits, vegetable, fish, lean meats, nuts, and olive oil. Limit salt. -recommend moderate walking, 3-5 times/week for 30-50 minutes each session. Aim for at least 150 minutes.week. Goal should be pace of 3 miles/hours, or walking 1.5 miles in 30 minutes -recommend avoidance of tobacco products. Avoid excess alcohol. -ASCVD risk score: The ASCVD Risk score (Arnett  DK, et al., 2019) failed to calculate for the following reasons:   The valid total cholesterol range is 130 to 320 mg/dL    Plan for follow up:  Buford Dresser, MD, PhD, [facility] at [facility] at [facility] [address] [place], [place] [address] [phone]   Medication Adjustments/Labs and Tests Ordered: Current medicines are reviewed at length with the patient today.  Concerns regarding medicines are outlined above.  Orders Placed This Encounter  Procedures   Basic Metabolic Panel (BMET)   CBC   Lipoprotein A (LPA)   Lipid panel   EKG 12-Lead   Meds ordered this encounter  Medications   aspirin EC 81 MG tablet    Sig: Take 1 tablet (81 mg total) by mouth daily. Swallow whole.    Dispense:  90 tablet    Refill:  3    Patient Instructions  Medication Instructions:  Aspirin 81 mcg daily  *If you need a refill on your cardiac medications before your next appointment, please call your pharmacy*   Lab Work: BMP, CBC, Lipid panel, Lipoprotein A (LPA)  If you have labs (blood work) drawn today and your tests are completely normal, you will receive your results only by: [place] (if you have MyChart) OR A paper copy in the mail If you have any lab test that is abnormal or we need to change your treatment, we will call you to review the results.   Testing/Procedures: Your physician has requested that you have a cardiac  catheterization. Cardiac catheterization is used to diagnose and/or treat various heart conditions. Doctors may recommend this procedure for a number of different reasons. The most common reason is to evaluate chest pain. Chest pain can be a symptom of coronary artery disease (CAD), and cardiac catheterization can show whether plaque is narrowing or blocking your heart's arteries. This procedure is also used to evaluate the valves, as well as measure the blood flow and oxygen levels in different parts of your heart. For further information please visit [email or web address]. Please follow instruction sheet, as given.    Follow-Up: At [facility], you and your health needs are our priority.  As part of our continuing mission to provide you with exceptional heart care, we have created designated Provider Care Teams.  These Care Teams include your primary Cardiologist (physician) and Advanced Practice Providers (APPs -  Physician Assistants and Nurse Practitioners) who all work together to provide you with the care you need, when you need it.  We recommend signing up for the patient portal called "MyChart".  Sign up information is provided on this After Visit Summary.  MyChart is used to connect with patients for Virtual Visits (Telemedicine).  Patients are able to view lab/test results, encounter notes, upcoming appointments, etc.  Non-urgent messages can be sent to your provider as well.   To learn more about what you can do with MyChart, go to [email or web address].    Your next appointment:   2 week(s) After Cath is completed.   Provider:   Buford Dresser, MD    Other Instructions  [place] [id number] Dept: [place]  06/20/2022  You are scheduled for a Cardiac Catheterization on Thursday, March 7 with Dr. Lauree Chandler.  1.  Please arrive at the [facility] (Main Entrance A) at  [facility]: [address] [place], [place] [address] at 8:30 AM (This time is two hours before your procedure to ensure your preparation). Free valet parking service is available.   Special note: Every effort is made to have your procedure done on time. Please understand that emergencies sometimes delay scheduled procedures.  2. Diet: Do not eat solid foods after midnight.  The patient may have clear liquids until 5am upon the day of the procedure.  3. Labs: You will need to have blood drawn today  4. Medication instructions in preparation for your procedure:   Contrast Allergy: No  Hold Furosemide morning of Cath.   Do not take Diabetes Med Glucophage (Metformin) on the day of the procedure and HOLD 48 HOURS AFTER THE PROCEDURE.  On the morning of your procedure, take your Aspirin 81 mg and any morning medicines NOT listed above.  You may use sips of water.  5. Plan for one night stay--bring personal belongings. 6. Bring a current list of your medications and current insurance cards. 7. You MUST have a responsible person to drive you home. 8. Someone MUST be with you the first 24 hours after you arrive home or your discharge will be delayed. 9. Please wear clothes that are easy to get on and off and wear slip-on shoes.  Thank you for allowing [place] to care for you!   -- [facility]   Signed, Buford Dresser, MD PhD 06/20/2022 12:41 PM    [place]

## 2022-06-21 ENCOUNTER — Encounter: Payer: Self-pay | Admitting: Cardiology

## 2022-06-21 ENCOUNTER — Telehealth: Payer: Self-pay | Admitting: *Deleted

## 2022-06-21 NOTE — Telephone Encounter (Signed)
Cardiac Catheterization scheduled at [facility] for: Thursday June 22, 2022 10:30 AM Arrival time and place: [place] Entrance A at: 8:30 AM  Nothing to eat after midnight prior to procedure, clear liquids until 5 AM day of procedure.  Medication instructions: -Hold:  Lasix-AM of procedure   Metformin-day of procedure and 48 hours post procedure -Other usual morning medications can be taken with sips of water including aspirin 81 mg.  Confirmed patient has responsible adult to drive home post procedure and be with patient first 24 hours after arriving home.  Reviewed procedure instructions with patient.

## 2022-06-22 ENCOUNTER — Other Ambulatory Visit: Payer: Self-pay

## 2022-06-22 ENCOUNTER — Ambulatory Visit (HOSPITAL_COMMUNITY)
Admission: RE | Admit: 2022-06-22 | Discharge: 2022-06-22 | Disposition: A | Discharge status: Home / Self Care | Payer: No Typology Code available for payment source | Attending: Cardiovascular Disease | Admitting: Cardiovascular Disease

## 2022-06-22 ENCOUNTER — Encounter (HOSPITAL_COMMUNITY): Payer: Self-pay | Admitting: Cardiovascular Disease

## 2022-06-22 ENCOUNTER — Encounter (HOSPITAL_COMMUNITY)
Admission: RE | Disposition: A | Discharge status: Home / Self Care | Payer: Self-pay | Source: Home / Self Care | Attending: Cardiovascular Disease

## 2022-06-22 DIAGNOSIS — G4733 Obstructive sleep apnea (adult) (pediatric): Secondary | ICD-10-CM | POA: Insufficient documentation

## 2022-06-22 DIAGNOSIS — Z7984 Long term (current) use of oral hypoglycemic drugs: Secondary | ICD-10-CM | POA: Insufficient documentation

## 2022-06-22 DIAGNOSIS — Z7985 Long-term (current) use of injectable non-insulin antidiabetic drugs: Secondary | ICD-10-CM | POA: Insufficient documentation

## 2022-06-22 DIAGNOSIS — I2510 Atherosclerotic heart disease of native coronary artery without angina pectoris: Secondary | ICD-10-CM | POA: Insufficient documentation

## 2022-06-22 DIAGNOSIS — R0602 Shortness of breath: Secondary | ICD-10-CM | POA: Diagnosis not present

## 2022-06-22 DIAGNOSIS — E785 Hyperlipidemia, unspecified: Secondary | ICD-10-CM | POA: Insufficient documentation

## 2022-06-22 DIAGNOSIS — Z7982 Long term (current) use of aspirin: Secondary | ICD-10-CM | POA: Insufficient documentation

## 2022-06-22 DIAGNOSIS — Z8249 Family history of ischemic heart disease and other diseases of the circulatory system: Secondary | ICD-10-CM | POA: Insufficient documentation

## 2022-06-22 DIAGNOSIS — Z6841 Body Mass Index (BMI) 40.0 and over, adult: Secondary | ICD-10-CM | POA: Insufficient documentation

## 2022-06-22 DIAGNOSIS — Z79899 Other long term (current) drug therapy: Secondary | ICD-10-CM | POA: Diagnosis not present

## 2022-06-22 DIAGNOSIS — I509 Heart failure, unspecified: Secondary | ICD-10-CM | POA: Insufficient documentation

## 2022-06-22 DIAGNOSIS — J449 Chronic obstructive pulmonary disease, unspecified: Secondary | ICD-10-CM | POA: Diagnosis not present

## 2022-06-22 DIAGNOSIS — I110 Hypertensive heart disease with heart failure: Secondary | ICD-10-CM | POA: Diagnosis not present

## 2022-06-22 DIAGNOSIS — R0609 Other forms of dyspnea: Secondary | ICD-10-CM | POA: Diagnosis not present

## 2022-06-22 DIAGNOSIS — E119 Type 2 diabetes mellitus without complications: Secondary | ICD-10-CM | POA: Insufficient documentation

## 2022-06-22 HISTORY — PX: LEFT HEART CATH AND CORONARY ANGIOGRAPHY: CATH118249

## 2022-06-22 LAB — BASIC METABOLIC PANEL
BUN/Creatinine Ratio: 14 (ref 10–24)
BUN: 14 mg/dL (ref 8–27)
CO2: 19 mmol/L — ABNORMAL LOW (ref 20–29)
Calcium: 9.4 mg/dL (ref 8.6–10.2)
Chloride: 98 mmol/L (ref 96–106)
Creatinine, Ser: 0.98 mg/dL (ref 0.76–1.27)
Glucose: 93 mg/dL (ref 70–99)
Potassium: 4.7 mmol/L (ref 3.5–5.2)
Sodium: 140 mmol/L (ref 134–144)
eGFR: 86 mL/min/{1.73_m2} (ref >59)

## 2022-06-22 LAB — GLUCOSE, CAPILLARY
Glucose-Capillary: 106 mg/dL — ABNORMAL HIGH (ref 70–99)
Glucose-Capillary: 96 mg/dL (ref 70–99)

## 2022-06-22 LAB — CBC
Hematocrit: 38.2 % (ref 37.5–51.0)
Hemoglobin: 12.8 g/dL — ABNORMAL LOW (ref 13.0–17.7)
MCH: 28.6 pg (ref 26.6–33.0)
MCHC: 33.5 g/dL (ref 31.5–35.7)
MCV: 85 fL (ref 79–97)
Platelets: 253 10*3/uL (ref 150–450)
RBC: 4.48 x10E6/uL (ref 4.14–5.80)
RDW: 14.6 % (ref 11.6–15.4)
WBC: 6.7 10*3/uL (ref 3.4–10.8)

## 2022-06-22 LAB — LIPID PANEL
Chol/HDL Ratio: 3.5 ratio (ref 0.0–5.0)
Cholesterol, Total: 106 mg/dL (ref 100–199)
HDL: 30 mg/dL — ABNORMAL LOW (ref >39)
LDL Chol Calc (NIH): 33 mg/dL (ref 0–99)
Triglycerides: 284 mg/dL — ABNORMAL HIGH (ref 0–149)
VLDL Cholesterol Cal: 43 mg/dL — ABNORMAL HIGH (ref 5–40)

## 2022-06-22 LAB — LIPOPROTEIN A (LPA): Lipoprotein (a): <8.4 nmol/L (ref <75.0)

## 2022-06-22 SURGERY — LEFT HEART CATH AND CORONARY ANGIOGRAPHY
Anesthesia: LOCAL

## 2022-06-22 MED ORDER — SODIUM CHLORIDE 0.9% FLUSH: 3.0000 mL | Freq: Two times a day (BID) | INTRAVENOUS | Status: DC

## 2022-06-22 MED ORDER — VERAPAMIL HCL 2.5 MG/ML IV SOLN
INTRAVENOUS | Status: AC
  Filled 2022-06-22: qty 2

## 2022-06-22 MED ORDER — MIDAZOLAM HCL 2 MG/2ML IJ SOLN
INTRAMUSCULAR | Status: DC | PRN
  Administered 2022-06-22 (×2): 1 mg via INTRAVENOUS

## 2022-06-22 MED ORDER — LABETALOL HCL 5 MG/ML IV SOLN: 10.0000 mg | INTRAVENOUS | Status: DC | PRN

## 2022-06-22 MED ORDER — SODIUM CHLORIDE 0.9% FLUSH: 3.0000 mL | INTRAVENOUS | Status: DC | PRN

## 2022-06-22 MED ORDER — IOHEXOL 350 MG/ML SOLN
INTRAVENOUS | Status: DC | PRN
  Administered 2022-06-22: 70 mL

## 2022-06-22 MED ORDER — SODIUM CHLORIDE 0.9 % IV SOLN: 250.0000 mL | INTRAVENOUS | Status: DC | PRN

## 2022-06-22 MED ORDER — ASPIRIN 81 MG PO CHEW: 81.0000 mg | CHEWABLE_TABLET | ORAL | Status: DC

## 2022-06-22 MED ORDER — HEPARIN (PORCINE) IN NACL 1000-0.9 UT/500ML-% IV SOLN
INTRAVENOUS | Status: DC | PRN
  Administered 2022-06-22 (×2): 500 mL

## 2022-06-22 MED ORDER — FENTANYL CITRATE (PF) 100 MCG/2ML IJ SOLN
INTRAMUSCULAR | Status: AC
  Filled 2022-06-22: qty 2

## 2022-06-22 MED ORDER — ONDANSETRON HCL 4 MG/2ML IJ SOLN: 4.0000 mg | Freq: Four times a day (QID) | INTRAMUSCULAR | Status: DC | PRN

## 2022-06-22 MED ORDER — HYDRALAZINE HCL 20 MG/ML IJ SOLN: 10.0000 mg | INTRAMUSCULAR | Status: DC | PRN

## 2022-06-22 MED ORDER — SODIUM CHLORIDE 0.9 % IV SOLN: INTRAVENOUS | Status: DC

## 2022-06-22 MED ORDER — LIDOCAINE HCL (PF) 1 % IJ SOLN
INTRAMUSCULAR | Status: DC | PRN
  Administered 2022-06-22: 5 mL

## 2022-06-22 MED ORDER — MIDAZOLAM HCL 2 MG/2ML IJ SOLN
INTRAMUSCULAR | Status: AC
  Filled 2022-06-22: qty 2

## 2022-06-22 MED ORDER — SODIUM CHLORIDE 0.9 % WEIGHT BASED INFUSION
3.0000 mL/kg/h | INTRAVENOUS | Status: AC
  Administered 2022-06-22: 3 mL/kg/h via INTRAVENOUS

## 2022-06-22 MED ORDER — VERAPAMIL HCL 2.5 MG/ML IV SOLN
INTRAVENOUS | Status: DC | PRN
  Administered 2022-06-22: 10 mL via INTRA_ARTERIAL

## 2022-06-22 MED ORDER — SODIUM CHLORIDE 0.9 % WEIGHT BASED INFUSION: 1.0000 mL/kg/h | INTRAVENOUS | Status: DC

## 2022-06-22 MED ORDER — ACETAMINOPHEN 325 MG PO TABS: 650.0000 mg | ORAL_TABLET | ORAL | Status: DC | PRN

## 2022-06-22 MED ORDER — LIDOCAINE HCL (PF) 1 % IJ SOLN
INTRAMUSCULAR | Status: AC
  Filled 2022-06-22: qty 30

## 2022-06-22 MED ORDER — HEPARIN SODIUM (PORCINE) 1000 UNIT/ML IJ SOLN
INTRAMUSCULAR | Status: DC | PRN
  Administered 2022-06-22: 7000 [IU] via INTRAVENOUS

## 2022-06-22 MED ORDER — FENTANYL CITRATE (PF) 100 MCG/2ML IJ SOLN
INTRAMUSCULAR | Status: DC | PRN
  Administered 2022-06-22 (×2): 25 ug via INTRAVENOUS

## 2022-06-22 SURGICAL SUPPLY — 9 items

## 2022-06-22 NOTE — Discharge Instructions (Signed)

## 2022-06-22 NOTE — Interval H&P Note (Signed)
History and Physical Interval Note:  06/22/2022 8:49 AM  Dakin Phillips [place]  has presented today for surgery, with the diagnosis of chest pain - shortness of breath.  The various methods of treatment have been discussed with the patient and family. After consideration of risks, benefits and other options for treatment, the patient has consented to  Procedure(s): LEFT HEART CATH AND CORONARY ANGIOGRAPHY (N/A) as a surgical intervention.  The patient's history has been reviewed, patient examined, no change in status, stable for surgery.  I have reviewed the patient's chart and labs.  Questions were answered to the patient's satisfaction.    Cath Lab Visit (complete for each Cath Lab visit)  Clinical Evaluation Leading to the Procedure:   ACS: No.  Non-ACS:    Anginal Classification: CCS II  Anti-ischemic medical therapy: Maximal Therapy (2 or more classes of medications)  Non-Invasive Test Results: No non-invasive testing performed  Prior CABG: No previous CABG        Lauree Chandler

## 2022-06-23 ENCOUNTER — Telehealth: Payer: Self-pay | Admitting: Cardiovascular Disease

## 2022-06-23 NOTE — Telephone Encounter (Signed)
Pt stated they are ready to take off their bandage from their surgery yesterday. Pt stated they are worried about changing it and has some questions about how to change the bandage. Please advise.

## 2022-06-23 NOTE — Telephone Encounter (Signed)
Patient states he was discharged from hospital with a white plastic splint that fits under her hand, wrist and thumb and is held in place with Coban. Patient states he took it apart to replace the gauze and tegaderm that was placed after his TR band was removed. He is asking if he can leave it off as puncture site at wrist is dry with no signs of infection, b leeding, bruising. Spoke to Dr. Glennie Hawk who states patient is fine to leave it off. Patient verbalizes understanding to alert our office if any oozing does occur or if site becomes warm or has a raised area.

## 2022-07-04 ENCOUNTER — Ambulatory Visit (HOSPITAL_BASED_OUTPATIENT_CLINIC_OR_DEPARTMENT_OTHER): Payer: No Typology Code available for payment source | Admitting: Family

## 2022-07-04 NOTE — Progress Notes (Deleted)
Office Visit    Patient Name: Steven Rubio Date of Encounter: 07/04/2022  PCP:  [place]  Cardiologist:  Buford Dresser, MD  Advanced Practice Provider:  No care team member to display Electrophysiologist:  None      Chief Complaint    Steven Rubio is a 62 y.o. male presents today for follow-up after LHC  Past Medical History    Past Medical History:  Diagnosis Date   Arthritis    Asthma    Back pain    CHF (congestive heart failure) ([place])    COPD (chronic obstructive pulmonary disease) ([place])    Coronary artery disease    Diabetes mellitus without complication ([place])    Hypertension    Past Surgical History:  Procedure Laterality Date   LEFT HEART CATH AND CORONARY ANGIOGRAPHY N/A 06/22/2022   Procedure: LEFT HEART CATH AND CORONARY ANGIOGRAPHY;  Surgeon: Burnell Blanks, MD;  Location: [facility];  Service: Cardiovascular;  Laterality: N/A;   VENTRAL [facility]ERNIA [facility]  [facility]    Allergies  Allergies  Allergen Reactions   Penicillins Hives    Has patient had a PCN reaction causing immediate rash, facial/tongue/throat swelling, SOB or lightheadedness with hypotension: No Has patient had a PCN reaction causing severe rash involving mucus membranes or skin necrosis: No Has patient had a PCN reaction that required hospitalization: No Has patient had a PCN reaction occurring within the last 10 years: No If all of the above answers are "NO", then may proceed with Cephalosporin use.     History of Present Illness    Steven Rubio is a 62 y.o. male with a hx of nonobstructive coronary disease, obesity, hypertension, DM 2, hyperlipidemia, OSA on BiPAP, COPD, former tobacco use last seen for cardiac catheterization 06/22/2022.  Family history notable for coronary artery disease.  Established with Dr. Harrell Gave 06/20/2022 at the request of the [facility] for evaluation of exertional dyspnea.   Prior [place] 2013 with nonobstructive disease (MAC stenosis 20% in LAD, 20% circumflex, 20% RCA), Myoview 2016 normal.  Chest pain episode 2021 with echocardiogram revealing pericardial effusion treated with colchicine.  Myoview 02/19/2020 LVEF 55 to 70%, fixed inferior wall defect without reversibility.  Attempted LHC 111/22/21 for chest pain and became agitated due to PTSD when CR near his head and could not tolerate.  Echocardiogram 05/28/2020 LVEF normal size, mild LVH, normal LVEF, mild aortic sclerosis with small to moderate pericardial effusion.  Initial visit with Dr. Harrell Gave [facility]/2022 [facility].  He reported occasional chest tightness/aching at rest or with activity not as severe as previous.  He had persistent exertional dyspnea which he attributed to COPD.  Note he was told in his 96s he had a "spasm "in his heart but not a heart attack.  He has OSA but cannot tolerate any CPAP due to history of PTSD/anxiety/claustrophobia.  Subsequently underwent LHC 06/16/2022 which revealed proximal RCA 60% stenosed which was moderate, eccentric stenosis and not flow-limiting.  No obstructive disease noted in LAD and circumflex.  Recommended for medical management.  He presents today for follow-up. ***  EKGs/Labs/Other Studies Reviewed:   The following studies were reviewed today: Cardiac Studies & Procedures   CARDIAC CATHETERIZATION  CARDIAC CATHETERIZATION 06/22/2022  Narrative   Prox RCA lesion is 60% stenosed.  No obstructive disease noted in the LAD and Circumflex Moderate, eccentric stenosis in the mid RCA This does not appear  to be flow limiting. LVEDP=12 mmHg  Recommendations: Medical management of CAD. Aggressive risk factor modification/weight loss.  Findings Coronary Findings Diagnostic  Dominance: Right  Left Anterior Descending Vessel is large.  Left Circumflex Vessel is moderate in size.  Right Coronary Artery Vessel is large. Prox RCA lesion is 60% stenosed. The  lesion is eccentric.  Intervention  No interventions have been documented.                EKG:  EKG is not ordered today.  Recent Labs: 06/20/2022: BUN 14; Creatinine, Ser 0.98; Hemoglobin 12.8; Platelets 253; Potassium 4.7; Sodium 140  Recent Lipid Panel    Component Value Date/Time   CHOL 106 06/20/2022 1013   TRIG 284 (H) 06/20/2022 1013   HDL 30 (L) 06/20/2022 1013   CHOLHDL 3.5 06/20/2022 1013   LDLCALC 33 06/20/2022 1013    Home Medications   No outpatient medications have been marked as taking for the 07/04/22 encounter (Appointment) with Loel Dubonnet, NP.     Review of Systems      All other systems reviewed and are otherwise negative except as noted above.  Physical Exam    VS:  There were no vitals taken for this visit. , BMI There is no height or weight on file to calculate BMI.  Wt Readings from Last 3 Encounters:  06/22/22 (!) 335 lb (152 kg)  06/20/22 (!) 338 lb (153.3 kg)  05/05/17 225 lb 15.5 oz (102.5 kg)     GEN: Well nourished, well developed, in no acute distress. HEENT: normal. Neck: Supple, no JVD, carotid bruits, or masses. Cardiac: ***RRR, no murmurs, rubs, or gallops. No clubbing, cyanosis, edema.  ***Radials/PT 2+ and equal bilaterally.  Respiratory:  ***Respirations regular and unlabored, clear to auscultation bilaterally. GI: Soft, nontender, nondistended. MS: No deformity or atrophy. Skin: Warm and dry, no rash. Neuro:  Strength and sensation are intact. Psych: Normal affect.  Assessment & Plan    CAD-  HLD- LPA 06/20/2022 normal.  Lipid panel 06/20/2022 LDL 33.  Continue rosuvastatin 20 mg daily.  Obesity-  OSA- unable to tolerate CPAP due to history of PTSD/anxiety.  COPD- Continue to follow with PCP.   Obesity - Weight loss via diet and exercise encouraged. Discussed the impact being overweight would have on cardiovascular risk. Continue Semaglutide per PCP.  No BP recorded.  {Refresh Note OR Click here to enter BP   :1}***      Disposition: Follow up {follow up:15908} with Buford Dresser, MD or APP.  Signed, Loel Dubonnet, NP 07/04/2022, 10:10 AM [place]

## 2022-07-05 ENCOUNTER — Telehealth (HOSPITAL_BASED_OUTPATIENT_CLINIC_OR_DEPARTMENT_OTHER): Payer: Self-pay

## 2022-07-05 NOTE — Telephone Encounter (Signed)
-----   Message from Loel Dubonnet, NP sent at 07/05/2022 10:19 AM EDT ----- Patient no showed his appointment.   Will need call with lab results.  ----- Message ----- From: Gerald Stabs, RN Sent: 07/04/2022   8:30 AM EDT To: Loel Dubonnet, NP  Results for a patient you are seeing today  ----- Message ----- From: Buford Dresser, MD Sent: 07/04/2022   8:29 AM EDT To: [facility] Triage  Kidney function stable, blood counts stable. LDL (bad cholesterol) is very well controlled, and the lp(a) is normal. Triglycerides are higher than we want (want <150). This is best controlled with lifestyle. His cath was reassuring that his symptoms are not due to blockages, which is good news. I'd recommend he continue to work with the [place] to work on lifestyle change, and I think the semaglutide will help as well. He sees [place] today.

## 2022-07-05 NOTE — Telephone Encounter (Signed)
Call to patient with lab results and recommendations. Pt states he missed his appt. With Laurann [place] on the March 19th.  Pt explained he is talking with the [place] tomorrow and since his lab work was pretty good and the cath was ok he will check with his [place] cardiologist to see if he needs to schedule another follow up with Dr. Harrell Gave.  If so he will call to reschedule. Georgana Curio MHA RN CCM

## 2022-08-15 ENCOUNTER — Telehealth (HOSPITAL_BASED_OUTPATIENT_CLINIC_OR_DEPARTMENT_OTHER): Payer: Self-pay

## 2022-08-15 ENCOUNTER — Other Ambulatory Visit (HOSPITAL_COMMUNITY): Payer: Self-pay

## 2022-08-15 ENCOUNTER — Ambulatory Visit (INDEPENDENT_AMBULATORY_CARE_PROVIDER_SITE_OTHER): Payer: No Typology Code available for payment source | Admitting: Family

## 2022-08-15 ENCOUNTER — Encounter (HOSPITAL_BASED_OUTPATIENT_CLINIC_OR_DEPARTMENT_OTHER): Payer: Self-pay | Admitting: Family

## 2022-08-15 VITALS — BP 122/64 | HR 77 | Ht 66.0 in | Wt 326.0 lb

## 2022-08-15 DIAGNOSIS — E781 Pure hyperglyceridemia: Secondary | ICD-10-CM | POA: Diagnosis not present

## 2022-08-15 DIAGNOSIS — E785 Hyperlipidemia, unspecified: Secondary | ICD-10-CM

## 2022-08-15 DIAGNOSIS — I25118 Atherosclerotic heart disease of native coronary artery with other forms of angina pectoris: Secondary | ICD-10-CM

## 2022-08-15 DIAGNOSIS — I10 Essential (primary) hypertension: Secondary | ICD-10-CM | POA: Diagnosis not present

## 2022-08-15 MED ORDER — ICOSAPENT ETHYL 1 G PO CAPS: 2.0000 g | ORAL_CAPSULE | Freq: Two times a day (BID) | ORAL | 0 refills | Status: AC

## 2022-08-15 MED ORDER — ICOSAPENT ETHYL 1 G PO CAPS: 2.0000 g | ORAL_CAPSULE | Freq: Two times a day (BID) | ORAL | 3 refills | Status: AC

## 2022-08-15 NOTE — Telephone Encounter (Signed)
Per test claim Vascepa brand is covered and no PA is required.

## 2022-08-15 NOTE — Telephone Encounter (Signed)
CALLED PATIENT TO NOTIFY, NO ANSWER, UNABLE TO LEAVE MESSAGE

## 2022-08-15 NOTE — Telephone Encounter (Signed)
Patient started on Vascepa 2g BID today in clinic, will likely need prior auth

## 2022-08-15 NOTE — Progress Notes (Signed)
Office Visit    Patient Name: Steven Rubio Date of Encounter: 08/15/2022  PCP:  [facility]  Cardiologist:  Jodelle Red, MD  Advanced Practice Provider:  No care team member to display Electrophysiologist:  None      Chief Complaint    Steven Rubio is a 62 y.o. male presents today for follow up after cardiac catheterization   Past Medical History    Past Medical History:  Diagnosis Date   Arthritis    Asthma    Back pain    CHF (congestive heart failure) (HCC)    COPD (chronic obstructive pulmonary disease) (HCC)    Coronary artery disease    Diabetes mellitus without complication (HCC)    Hypertension    Past Surgical History:  Procedure Laterality Date   LEFT HEART CATH AND CORONARY ANGIOGRAPHY N/A 06/22/2022   Procedure: LEFT HEART CATH AND CORONARY ANGIOGRAPHY;  Surgeon: Kathleene Hazel, MD;  Location: [facility];  Service: Cardiovascular;  Laterality: N/A;   VENTRAL HERNIA REPAIR  2013   [facility]    Allergies  Allergies  Allergen Reactions   Penicillins Hives    Has patient had a PCN reaction causing immediate rash, facial/tongue/throat swelling, SOB or lightheadedness with hypotension: No Has patient had a PCN reaction causing severe rash involving mucus membranes or skin necrosis: No Has patient had a PCN reaction that required hospitalization: No Has patient had a PCN reaction occurring within the last 10 years: No If all of the above answers are "NO", then may proceed with Cephalosporin use.     History of Present Illness    Steven Rubio is a 62 y.o. male with a hx of COPD, CAD, HLD,  obesity, OSA on BiPAP, prior tobacco use, DM 2, hypertension, PTSD/anxiety/claustrophobia last seen for [facility] 06/22/22.  Established with Dr. Cristal Deer 06/20/2022 after referral from the [facility] with request for LHC.  Noted in his 30s he was told he had a "spasm "in his heart but not a heart  attack.  Prior catheterization 04/2011 with nonobstructive disease (20% LAD, 20% circumflex, 20% RCA).  Myoview 02/19/2020 LVEF 65 to 70%, fixed inferior wall defect without reversibility.  Attempted LHC 03/07/2020 [facility] but became agitated due to PTSD when CR near his head and was recommended for general anesthesia for cath.  Prior chest pain 2021 with echocardiogram revealing pericardial effusion treated with colchicine.  At initial visit he had noted episodes of chest pain at rest or with activity.  Also noted exertional dyspnea.  Cardiac catheterization discussed in detail and he noted that he had tolerated cath previously but was having a particularly hard day when at [facility].  He felt comfortable proceeding with catheterization with typical conscious sedation.  Aspirin EC 81 mg daily was resumed. Lab work  06/20/22 total cholesterol 106, LDL 33, triglycerides 284, HDL 30. Lp(a) <8.4. LHC 06/22/22 with 60% mRCA stenosis recommended for medical management.   Echo 07/17/22 at [facility] with LVEF >55%, gr1DD, LA mildly dilated, mild aortic sclerosis without stenosis, trace MR/TR, small pericardial effusion (<1cm).   Presents today for follow up independently. Right radial artery site healing well without issue. Reviewed cardiac catheterization in detail. Reviewed GDMT for coronary artery disease.. Drinking diet soda or water. He attributes the elevated triglycerides to his soda and hot chocolate intake.  Notes previously had stomach upset with over-the-counter fish oil but willing to trial Vascepa.  Exertional dyspnea is unchanged.  Discussed likely multifactorial obesity, deconditioning, lung disease.  EKGs/Labs/Other Studies Reviewed:   The following studies were reviewed today:  Cardiac Studies & Procedures   CARDIAC CATHETERIZATION  CARDIAC CATHETERIZATION 06/22/2022  Narrative   Prox RCA lesion is 60% stenosed.  No obstructive disease noted in the LAD and Circumflex Moderate, eccentric stenosis in the mid  RCA This does not appear to be flow limiting. LVEDP=12 mmHg  Recommendations: Medical management of CAD. Aggressive risk factor modification/weight loss.  Findings Coronary Findings Diagnostic  Dominance: Right  Left Anterior Descending Vessel is large.  Left Circumflex Vessel is moderate in size.  Right Coronary Artery Vessel is large. Prox RCA lesion is 60% stenosed. The lesion is eccentric.  Intervention  No interventions have been documented.                Echo 07/17/22 at [facility]  Interpretation Summary There is mild concentric left ventricular hypertrophy. The left ventricle is borderline dilated. Left ventricular systolic function is normal. Ejection Fraction = >55% (Visual Estimation). No regional wall motion abnormalities noted. Grade I diastolic dysfunction, (abnormal relaxation pattern). The left atrium is mildly dilated. There is mild aortic sclerosis. No hemodynamically significant valvular aortic stenosis. There is trace mitral regurgitation. There is trace tricuspid regurgitation. Right ventricular systolic pressure unable to be evaluated due to insufficient TR. Small pericardial effusion (<1cm)  EKG:  EKG is not ordered today.   Recent Labs: 06/20/2022: BUN 14; Creatinine, Ser 0.98; Hemoglobin 12.8; Platelets 253; Potassium 4.7; Sodium 140  Recent Lipid Panel    Component Value Date/Time   CHOL 106 06/20/2022 1013   TRIG 284 (H) 06/20/2022 1013   HDL 30 (L) 06/20/2022 1013   CHOLHDL 3.5 06/20/2022 1013   LDLCALC 33 06/20/2022 1013    Home Medications   Current Meds  Medication Sig   acetaminophen (TYLENOL) 500 MG tablet Take 500 mg by mouth every 8 (eight) hours as needed for mild pain or moderate pain.    allopurinol (ZYLOPRIM) 300 MG tablet Take 300 mg by mouth daily.   amLODipine (NORVASC) 10 MG tablet Take 5 mg by mouth at bedtime.   ARTIFICIAL TEAR OP Place 1 drop into both eyes every 6 (six) hours as needed (dry eyes).   aspirin EC 81 MG  tablet Take 1 tablet (81 mg total) by mouth daily. Swallow whole.   atenolol (TENORMIN) 50 MG tablet Take 50 mg by mouth daily.   celecoxib (CELEBREX) 200 MG capsule Take 200 mg by mouth daily.   cetirizine (ZYRTEC) 10 MG tablet Take 10 mg by mouth daily as needed for allergies.   cholecalciferol (VITAMIN D) 1000 units tablet Take 1,000 Units by mouth at bedtime.   famotidine (PEPCID) 40 MG tablet Take 40 mg by mouth 2 (two) times daily.   fluticasone (FLONASE) 50 MCG/ACT nasal spray Place 1 spray into both nostrils daily as needed for allergies or rhinitis.   furosemide (LASIX) 40 MG tablet Take 40 mg by mouth daily.   gabapentin (NEURONTIN) 300 MG capsule Take 300 mg by mouth 3 (three) times daily.   ipratropium (ATROVENT) 0.06 % nasal spray Place 2 sprays into both nostrils at bedtime.   loperamide (IMODIUM A-D) 2 MG tablet Take 2 mg by mouth 4 (four) times daily as needed for diarrhea or loose stools.   losartan (COZAAR) 100 MG tablet Take 100 mg by mouth daily.   Magnesium Oxide 420 MG TABS Take 420 mg by mouth daily.   metFORMIN (GLUCOPHAGE) 1000 MG tablet Take  1,000 mg by mouth 2 (two) times daily with a meal.   omeprazole (PRILOSEC) 40 MG capsule Take 40 mg by mouth 2 (two) times daily.   oxybutynin (DITROPAN-XL) 5 MG 24 hr tablet Take 5 mg by mouth at bedtime.   rosuvastatin (CRESTOR) 20 MG tablet Take 20 mg by mouth daily.   Semaglutide, 1 MG/DOSE, 4 MG/3ML SOPN Inject 1 mg into the skin every Sunday.   sertraline (ZOLOFT) 100 MG tablet Take 100 mg by mouth every morning.   sildenafil (VIAGRA) 100 MG tablet Take 100 mg by mouth daily as needed for erectile dysfunction.   tamsulosin (FLOMAX) 0.4 MG CAPS capsule Take 0.4 mg by mouth 2 (two) times daily.   vitamin B-12 (CYANOCOBALAMIN) 500 MCG tablet Take 500 mcg by mouth daily.     Review of Systems      All other systems reviewed and are otherwise negative except as noted above.  Physical Exam    VS:  BP 122/64   Pulse 77    Ht 5\' 6"  (1.676 m)   Wt (!) 326 lb (147.9 kg)   BMI 52.62 kg/m  , BMI Body mass index is 52.62 kg/m.  Wt Readings from Last 3 Encounters:  08/15/22 (!) 326 lb (147.9 kg)  06/22/22 (!) 335 lb (152 kg)  06/20/22 (!) 338 lb (153.3 kg)     GEN: Well nourished, overweight, well developed, in no acute distress. HEENT: normal. Neck: Supple, no JVD, carotid bruits, or masses. Cardiac: RRR, no murmurs, rubs, or gallops. No clubbing, cyanosis, edema.  Radials/PT 2+ and equal bilaterally.  Respiratory:  Respirations regular and unlabored, clear to auscultation bilaterally. GI: Soft, nontender, nondistended. MS: No deformity or atrophy. Skin: Warm and dry, no rash. Neuro:  Strength and sensation are intact. Psych: Normal affect.  Assessment & Plan    DOE -multifactorial COPD, deconditioning, obesity.  LHC 06/2022 with RCA 60.7 stenosis recommended for medical management, detailed below.  Echo 07/2022 normal LVEF, small pericardial effusion, no significant valvular abnormalities. Will return cardiac care to [facility] provider.   CAD - LHC 06/2022 RCA 60% stenosis. Recommended for medical management. Continue GDMT Aspirin EC 81mg  daily, Atenolol 50mg  QD, Rosuvastatin 20mg  QD. Heart healthy diet and regular cardiovascular exercise encouraged.   HLD, LDL goal <55 / Hypertriglyceridemia - Continue Rosuvastatin 20mg  QD. Triglycerides 06/2022 of 284 with goal <150. Rx Vascepa 2g BID.  This will help to lower triglycerides as well as reduce cardiovascular risk. Recommend repeat lipid panel in 3 months.  Can be collected by [facility] provider, lab slips provided.  HTN - BP well controlled. Continue current antihypertensive regimen.    Obesity / BMI 52 - Weight loss via diet and exercise encouraged. Discussed the impact being overweight would have on cardiovascular risk.  Congratulated on weight loss.  On Ozempic per [place] provider with good weight loss.       Disposition: Follow up prn with Jodelle Red, MD or  APP.  As we have completed LHC he prefers to follow with his cardiologist at the [place].  He is very appreciative of [facility] care and we reiterated that we are available if he needs [place] in the future.  Signed, Alver Sorrow, NP 08/15/2022, 9:22 AM [facility]

## 2022-08-15 NOTE — Patient Instructions (Addendum)
Medication Instructions:  Your physician has recommended you make the following change in your medication:  Start: Vascepa 2g twice daily   *If you need a refill on your cardiac medications before your next appointment, please call your pharmacy*   Lab Work: Your physician recommends that you return for lab work in 3 months for Fasting Lipid Panel or with the [place]  If you have labs (blood work) drawn today and your tests are completely normal, you will receive your results only by: MyChart Message (if you have MyChart) OR A paper copy in the mail If you have any lab test that is abnormal or we need to change your treatment, we will call you to review the results.  Follow-Up: At [facility], you and your health needs are our priority.  As part of our continuing mission to provide you with exceptional heart care, we have created designated Provider Care Teams.  These Care Teams include your primary Cardiologist (physician) and Advanced Practice Providers (APPs -  Physician Assistants and Nurse Practitioners) who all work together to provide you with the care you need, when you need it.  We recommend signing up for the patient portal called "MyChart".  Sign up information is provided on this After Visit Summary.  MyChart is used to connect with patients for Virtual Visits (Telemedicine).  Patients are able to view lab/test results, encounter notes, upcoming appointments, etc.  Non-urgent messages can be sent to your provider as well.   To learn more about what you can do with MyChart, go to [email or web address].    Your next appointment:   Call us if you need [place], continue with Cardiologist at the [facility]   Other Instructions For coronary artery disease often called "heart disease" we aim for optimal guideline directed medical therapy. We use the "A, B, C"s to help keep [place] on track!  A = Aspirin 81mg  daily B = Beta blocker which helps to relax the heart. This is your Atenolol. C =  Cholesterol control. You take Rosuvastatin and Vascepa to help control your cholesterol.

## 2022-08-17 NOTE — Telephone Encounter (Signed)
Fyi to look at with the paper I am giving you

## 2023-05-28 ENCOUNTER — Telehealth: Payer: Self-pay | Admitting: Cardiology

## 2023-05-28 NOTE — Telephone Encounter (Signed)
 Called patient and explained that cardiac care has been sent to [facility] provider. Pt is to reach out to [place] for echo. Explained to pt that [facility] per [facility] in 4/24.

## 2023-05-28 NOTE — Telephone Encounter (Signed)
 Pt calling stating he was supposed to have an Echocardiogram done requested by the [place]. Requesting cb

## 2023-06-18 ENCOUNTER — Other Ambulatory Visit (HOSPITAL_BASED_OUTPATIENT_CLINIC_OR_DEPARTMENT_OTHER): Payer: Self-pay

## 2023-06-18 DIAGNOSIS — I3139 Other pericardial effusion (noninflammatory): Secondary | ICD-10-CM

## 2023-06-18 NOTE — Progress Notes (Signed)
 Echo request obtained from [place].

## 2023-06-19 ENCOUNTER — Ambulatory Visit (HOSPITAL_BASED_OUTPATIENT_CLINIC_OR_DEPARTMENT_OTHER): Payer: No Typology Code available for payment source

## 2023-06-19 DIAGNOSIS — I3139 Other pericardial effusion (noninflammatory): Secondary | ICD-10-CM

## 2023-06-19 LAB — ECHOCARDIOGRAM COMPLETE
Area-P 1/2: 4.06 cm2
S' Lateral: 3.06 cm

## 2023-06-28 ENCOUNTER — Telehealth (HOSPITAL_BASED_OUTPATIENT_CLINIC_OR_DEPARTMENT_OTHER): Payer: Self-pay

## 2023-06-28 NOTE — Telephone Encounter (Addendum)
 Call attempted, no answer, no VM set up   ----- Message from Alver Sorrow sent at 06/27/2023  1:41 PM EDT ----- Normal heart pumping function. Moderate pericardial effusion (fluid around the heart) but no evidence of stress on the heart which is good. The body will self-absorb this fluid. No significant valvular abnormalities. Overall good result.   Cardiology care previously followed at [facility], may need results sent to them or mailed to his home.

## 2023-06-29 NOTE — Telephone Encounter (Signed)
 2nd call attempt, Results called to patient who verbalizes understanding! Pt requested we send results to Dr. Lindell Noe who can forward to his [facility] cards as he cannot remember [facility] cards name at the moment.
